# Patient Record
Sex: Male | Born: 1960 | Race: White | Hispanic: No | Marital: Married | Smoking: Never smoker
Health system: Southern US, Community
[De-identification: ages and names within clinical notes are randomized; demographics above are authoritative.]

## PROBLEM LIST (undated history)

## (undated) ENCOUNTER — Emergency Department (HOSPITAL_COMMUNITY): Payer: PRIVATE HEALTH INSURANCE

## (undated) DIAGNOSIS — I10 Essential (primary) hypertension: Secondary | ICD-10-CM

## (undated) DIAGNOSIS — E785 Hyperlipidemia, unspecified: Secondary | ICD-10-CM

## (undated) DIAGNOSIS — Z87442 Personal history of urinary calculi: Secondary | ICD-10-CM

---

## 2006-06-03 ENCOUNTER — Emergency Department (HOSPITAL_COMMUNITY): Admission: EM | Admit: 2006-06-03 | Discharge: 2006-06-03 | Payer: Self-pay | Admitting: Emergency Medicine

## 2006-06-27 ENCOUNTER — Ambulatory Visit (HOSPITAL_COMMUNITY): Admission: RE | Admit: 2006-06-27 | Discharge: 2006-06-27 | Payer: Self-pay | Admitting: Urology

## 2008-08-01 ENCOUNTER — Encounter (INDEPENDENT_AMBULATORY_CARE_PROVIDER_SITE_OTHER): Payer: Self-pay | Admitting: Family Medicine

## 2008-08-01 ENCOUNTER — Ambulatory Visit: Payer: Self-pay | Admitting: Cardiology

## 2008-08-01 ENCOUNTER — Ambulatory Visit (HOSPITAL_COMMUNITY): Admission: RE | Admit: 2008-08-01 | Discharge: 2008-08-01 | Payer: Self-pay | Admitting: Family Medicine

## 2012-03-25 ENCOUNTER — Ambulatory Visit (HOSPITAL_COMMUNITY)
Admission: RE | Admit: 2012-03-25 | Discharge: 2012-03-25 | Disposition: A | Discharge status: Home / Self Care | Payer: PRIVATE HEALTH INSURANCE | Source: Ambulatory Visit | Attending: Family Medicine | Admitting: Family Medicine

## 2012-03-25 ENCOUNTER — Other Ambulatory Visit (HOSPITAL_COMMUNITY): Payer: Self-pay | Admitting: Family Medicine

## 2012-03-25 DIAGNOSIS — R05 Cough: Secondary | ICD-10-CM

## 2012-03-25 DIAGNOSIS — R059 Cough, unspecified: Secondary | ICD-10-CM | POA: Insufficient documentation

## 2012-03-25 DIAGNOSIS — R062 Wheezing: Secondary | ICD-10-CM | POA: Insufficient documentation

## 2012-03-25 DIAGNOSIS — M47814 Spondylosis without myelopathy or radiculopathy, thoracic region: Secondary | ICD-10-CM | POA: Insufficient documentation

## 2017-02-07 ENCOUNTER — Telehealth: Payer: Self-pay

## 2017-02-23 NOTE — Telephone Encounter (Signed)
Waiting on call from pt to see if he has ever had any complications with anesthesia, so I can complete the triage.

## 2017-02-28 ENCOUNTER — Other Ambulatory Visit: Payer: Self-pay

## 2017-02-28 DIAGNOSIS — Z1211 Encounter for screening for malignant neoplasm of colon: Secondary | ICD-10-CM

## 2017-02-28 NOTE — Telephone Encounter (Signed)
Gastroenterology Pre-Procedure Review  Request Date: 02/07/2017 Requesting Physician: Hilma Favors  PATIENT REVIEW QUESTIONS: The patient responded to the following health history questions as indicated:    1. Diabetes Melitis: no 2. Joint replacements in the past 12 months: no 3. Major health problems in the past 3 months: no 4. Has an artificial valve or MVP: no 5. Has a defibrillator: no 6. Has been advised in past to take antibiotics in advance of a procedure like teeth cleaning: no 7. Family history of colon cancer: no  8. Alcohol Use: no 9. History of sleep apnea: no  10. History of coronary artery or other vascular stents placed within the last 12 months: no 11. History of any prior anesthesia complications: no    MEDICATIONS & ALLERGIES:    Patient reports the following regarding taking any blood thinners:   Plavix? no Aspirin? no Coumadin? no Brilinta? no Xarelto? no Eliquis? no Pradaxa? no Savaysa? no Effient? no  Patient confirms/reports the following medications:  Current Outpatient Prescriptions  Medication Sig Dispense Refill  . aspirin EC 81 MG tablet Take 81 mg by mouth daily.    Marland Kitchen atorvastatin (LIPITOR) 10 MG tablet Take 10 mg by mouth daily.    Marland Kitchen olmesartan (BENICAR) 20 MG tablet Take 20 mg by mouth daily.     No current facility-administered medications for this visit.     Patient confirms/reports the following allergies:  Allergies  Allergen Reactions  . Hydrocodone Nausea Only    No orders of the defined types were placed in this encounter.   AUTHORIZATION INFORMATION Primary Insurance:   ID #:  Group #:  Pre-Cert / Auth required:  Pre-Cert / Auth #:   Secondary Insurance:  ID #:   Group #:  Pre-Cert / Auth required:  Pre-Cert / Auth #:   SCHEDULE INFORMATION: Procedure has been scheduled as follows:  Date: 03/18/2017               Time:  11:30 AM  Location: [facility] Short Stay  This Gastroenterology Pre-Precedure Review Form is  being routed to the following provider(s): R. Garfield Cornea, MD

## 2017-02-28 NOTE — Telephone Encounter (Signed)
Appropriate.

## 2017-03-01 MED ORDER — PEG 3350-KCL-NA BICARB-NACL 420 G PO SOLR: 4000.0000 mL | ORAL | 0 refills | Status: DC

## 2017-03-01 NOTE — Telephone Encounter (Signed)
Rx sent to the pharmacy and instructions mailed to pt.  

## 2017-03-03 NOTE — Telephone Encounter (Signed)
NO PA is needed for TCS 

## 2017-03-18 ENCOUNTER — Ambulatory Visit (HOSPITAL_COMMUNITY)
Admission: RE | Admit: 2017-03-18 | Discharge: 2017-03-18 | Disposition: A | Discharge status: Home / Self Care | Payer: PRIVATE HEALTH INSURANCE | Source: Ambulatory Visit | Attending: Internal Medicine | Admitting: Internal Medicine

## 2017-03-18 ENCOUNTER — Encounter (HOSPITAL_COMMUNITY): Payer: Self-pay | Admitting: *Deleted

## 2017-03-18 ENCOUNTER — Encounter (HOSPITAL_COMMUNITY)
Admission: RE | Disposition: A | Discharge status: Home / Self Care | Payer: Self-pay | Source: Ambulatory Visit | Attending: Internal Medicine

## 2017-03-18 DIAGNOSIS — Z885 Allergy status to narcotic agent status: Secondary | ICD-10-CM | POA: Diagnosis not present

## 2017-03-18 DIAGNOSIS — D122 Benign neoplasm of ascending colon: Secondary | ICD-10-CM | POA: Diagnosis not present

## 2017-03-18 DIAGNOSIS — Z8249 Family history of ischemic heart disease and other diseases of the circulatory system: Secondary | ICD-10-CM | POA: Insufficient documentation

## 2017-03-18 DIAGNOSIS — Z1211 Encounter for screening for malignant neoplasm of colon: Secondary | ICD-10-CM

## 2017-03-18 DIAGNOSIS — I10 Essential (primary) hypertension: Secondary | ICD-10-CM | POA: Insufficient documentation

## 2017-03-18 DIAGNOSIS — Z7982 Long term (current) use of aspirin: Secondary | ICD-10-CM | POA: Diagnosis not present

## 2017-03-18 DIAGNOSIS — Z809 Family history of malignant neoplasm, unspecified: Secondary | ICD-10-CM | POA: Diagnosis not present

## 2017-03-18 DIAGNOSIS — Z79899 Other long term (current) drug therapy: Secondary | ICD-10-CM | POA: Diagnosis not present

## 2017-03-18 DIAGNOSIS — Z1212 Encounter for screening for malignant neoplasm of rectum: Secondary | ICD-10-CM

## 2017-03-18 DIAGNOSIS — Z87442 Personal history of urinary calculi: Secondary | ICD-10-CM | POA: Diagnosis not present

## 2017-03-18 DIAGNOSIS — Z823 Family history of stroke: Secondary | ICD-10-CM | POA: Diagnosis not present

## 2017-03-18 HISTORY — DX: Personal history of urinary calculi: Z87.442

## 2017-03-18 HISTORY — PX: COLONOSCOPY: SHX5424

## 2017-03-18 HISTORY — DX: Essential (primary) hypertension: I10

## 2017-03-18 SURGERY — COLONOSCOPY
Anesthesia: Moderate Sedation

## 2017-03-18 MED ORDER — STERILE WATER FOR IRRIGATION IR SOLN
Status: DC | PRN
  Administered 2017-03-18: 2.5 mL

## 2017-03-18 MED ORDER — ONDANSETRON HCL 4 MG/2ML IJ SOLN
INTRAMUSCULAR | Status: AC
  Filled 2017-03-18: qty 2

## 2017-03-18 MED ORDER — MEPERIDINE HCL 100 MG/ML IJ SOLN
INTRAMUSCULAR | Status: AC
  Filled 2017-03-18: qty 2

## 2017-03-18 MED ORDER — MIDAZOLAM HCL 5 MG/5ML IJ SOLN
INTRAMUSCULAR | Status: AC
  Filled 2017-03-18: qty 10

## 2017-03-18 MED ORDER — MIDAZOLAM HCL 5 MG/5ML IJ SOLN
INTRAMUSCULAR | Status: DC | PRN
  Administered 2017-03-18: 1 mg via INTRAVENOUS
  Administered 2017-03-18 (×2): 2 mg via INTRAVENOUS
  Administered 2017-03-18: 1 mg via INTRAVENOUS

## 2017-03-18 MED ORDER — ONDANSETRON HCL 4 MG/2ML IJ SOLN
INTRAMUSCULAR | Status: DC | PRN
  Administered 2017-03-18: 4 mg via INTRAVENOUS

## 2017-03-18 MED ORDER — MEPERIDINE HCL 100 MG/ML IJ SOLN
INTRAMUSCULAR | Status: DC | PRN
  Administered 2017-03-18: 25 mg via INTRAVENOUS
  Administered 2017-03-18 (×2): 50 mg via INTRAVENOUS

## 2017-03-18 MED ORDER — SODIUM CHLORIDE 0.9 % IV SOLN
INTRAVENOUS | Status: DC
  Administered 2017-03-18: 1000 mL via INTRAVENOUS

## 2017-03-18 NOTE — Op Note (Signed)
[facility] Patient Name: Clifford [name] Procedure Date: 03/18/2017 11:13 AM MRN: [id number] Date of Birth: 1961/06/19 Attending MD: Norvel Richards , MD CSN: [id number] Age: 56 Admit Type: Outpatient Procedure:                Colonoscopy Indications:              Screening for colorectal malignant neoplasm Providers:                Norvel Richards, MD, Lurline Del, RN, Purcell Nails.                            [place], [employer] Referring MD:              Medicines:                Midazolam 6 mg IV, Meperidine 125 mg IV,                            Ondansetron 4 mg IV Complications:            No immediate complications. Estimated Blood Loss:     Estimated blood loss was minimal. Procedure:                Pre-Anesthesia Assessment:                           - Prior to the procedure, a History and Physical                            was performed, and patient medications and                            allergies were reviewed. The patient's tolerance of                            previous anesthesia was also reviewed. The risks                            and benefits of the procedure and the sedation                            options and risks were discussed with the patient.                            All questions were answered, and informed consent                            was obtained. Prior Anticoagulants: The patient has                            taken no previous anticoagulant or antiplatelet                            agents. ASA Grade Assessment: III - A patient with  severe systemic disease. After reviewing the risks                            and benefits, the patient was deemed in                            satisfactory condition to undergo the procedure.                           After obtaining informed consent, the colonoscope                            was passed under direct vision. Throughout the                            procedure,  the patient's blood pressure, pulse, and                            oxygen saturations were monitored continuously. The                            EC-[id number] ([id number]) scope was introduced through                            the anus and advanced to the the cecum, identified                            by appendiceal orifice and ileocecal valve. The                            colonoscopy was performed without difficulty. The                            patient tolerated the procedure well. The quality                            of the bowel preparation was adequate. The                            ileocecal valve, appendiceal orifice, and rectum                            were photographed. Scope In: 11:22:14 AM Scope Out: 11:39:10 AM Scope Withdrawal Time: 0 hours 14 minutes 0 seconds  Total Procedure Duration: 0 hours 16 minutes 56 seconds  Findings:      The perianal and digital rectal examinations were normal.      A 4 mm polyp was found in the ascending colon. The polyp was sessile.       The polyp was removed with a cold snare. Resection and retrieval were       complete. Estimated blood loss was minimal.      The exam was otherwise without abnormality on direct and retroflexion       views. Impression:               -  One 4 mm polyp in the ascending colon, removed                            with a cold snare. Resected and retrieved.                           - The examination was otherwise normal on direct                            and retroflexion views. Moderate Sedation:      Moderate (conscious) sedation was administered by the endoscopy nurse       and supervised by the endoscopist. The following parameters were       monitored: oxygen saturation, heart rate, blood pressure, respiratory       rate, EKG, adequacy of pulmonary ventilation, and response to care.       Total physician intraservice time was 26 minutes. Recommendation:           - Patient has a contact number  available for                            emergencies. The signs and symptoms of potential                            delayed complications were discussed with the                            patient. Return to normal activities tomorrow.                            Written discharge instructions were provided to the                            patient.                           - Resume previous diet.                           - Continue present medications.                           - Repeat colonoscopy date to be determined after                            pending pathology results are reviewed for                            surveillance based on pathology results.                           - Return to GI clinic (date not yet determined). Procedure Code(s):        --- Professional ---                           [phone], Colonoscopy, flexible; with removal  of                            tumor(s), polyp(s), or other lesion(s) by snare                            technique                           [id number], Moderate sedation services provided by the                            same physician or other qualified health care                            professional performing the diagnostic or                            therapeutic service that the sedation supports,                            requiring the presence of an independent trained                            observer to assist in the monitoring of the                            patient's level of consciousness and physiological                            status; initial 15 minutes of intraservice time,                            patient age 91 years or older                           [phone], Moderate sedation services; each additional                            15 minutes intraservice time Diagnosis Code(s):        --- Professional ---                           Z12.11, Encounter for screening for malignant                            neoplasm of  colon                           D12.2, Benign neoplasm of ascending colon CPT copyright 2016 American Medical Association. All rights reserved. The codes documented in this report are preliminary and upon coder review may  be revised to meet current compliance requirements. Cristopher Estimable. Svetlana Bagby, MD Norvel Richards, MD 03/18/2017 11:44:46 AM This report has been signed electronically. Number of Addenda: 0

## 2017-03-18 NOTE — H&P (Signed)
@  KWIO@   Primary Care Physician:  Sharilyn Sites, MD Primary Gastroenterologist:  Dr. Gala Romney  Pre-Procedure History & Physical: HPI:  Clifford [name] is a 56 y.o. male is here for a screening colonoscopy. No bowel symptoms. No family history of colon cancer.  Colonoscopy.  Past Medical History:  Diagnosis Date  . History of kidney stones   . Hypertension     History reviewed. No pertinent surgical history.  Prior to Admission medications   Medication Sig Start Date End Date Taking? Authorizing Provider  aspirin EC 81 MG tablet Take 81 mg by mouth daily.   Yes [provider]  atorvastatin (LIPITOR) 10 MG tablet Take 10 mg by mouth at bedtime.    Yes [provider]  olmesartan (BENICAR) 20 MG tablet Take 20 mg by mouth daily.   Yes [provider]    Allergies as of 02/28/2017 - Review Complete 02/21/2017  Allergen Reaction Noted  . Hydrocodone Nausea Only 02/07/2017    Family History  Problem Relation Age of Onset  . Cancer Mother   . Heart Problems Mother   . Heart attack Father   . Stroke Other     Social History   Social History  . Marital status: Married    Spouse name: N/A  . Number of children: N/A  . Years of education: N/A   Occupational History  . Not on file.   Social History Main Topics  . Smoking status: Never Smoker  . Smokeless tobacco: Never Used  . Alcohol use No  . Drug use: No  . Sexual activity: Not on file   Other Topics Concern  . Not on file   Social History Narrative  . No narrative on file    Review of Systems: See HPI, otherwise negative ROS  Physical Exam: BP 128/72   Pulse 94   Temp 98.4 F (36.9 C) (Oral)   Resp 15   Ht 5\' 6"  (1.676 m)   Wt 200 lb (90.7 kg)   SpO2 97%   BMI 32.28 kg/m  General:   Alert,  Well-developed, well-nourished, pleasant and cooperative in NAD Head:  Normocephalic and atraumatic. Lungs:  Clear throughout to auscultation.   No wheezes, crackles, or rhonchi. No acute  distress. Heart:  Regular rate and rhythm; 2 to 3/6 systolic ejection murmur , clicks, rubs,  or gallops. Abdomen:  Soft, nontender and nondistended. No masses, hepatosplenomegaly or hernias noted. Normal bowel sounds, without guarding, and without rebound.    Impression/Plan: Clifford [name] is now here to undergo a screening colonoscopy.  First ever average risk screening examination. Risks, benefits, limitations, imponderables and alternatives regarding colonoscopy have been reviewed with the patient. Questions have been answered. All parties agreeable.        Notice:  This dictation was prepared with Dragon dictation along with smaller phrase technology. Any transcriptional errors that result from this process are unintentional and may not be corrected upon review.

## 2017-03-18 NOTE — Discharge Instructions (Addendum)
Colon Polyps °Polyps are tissue growths inside the body. Polyps can grow in many places, including the large intestine (colon). A polyp may be a round bump or a mushroom-shaped growth. You could have one polyp or several. °Most colon polyps are noncancerous (benign). However, some colon polyps can become cancerous over time. °What are the causes? °The exact cause of colon polyps is not known. °What increases the risk? °This condition is more likely to develop in people who: °· Have a family history of colon cancer or colon polyps. °· Are older than 50 or older than 45 if they are African American. °· Have inflammatory bowel disease, such as ulcerative colitis or Crohn disease. °· Are overweight. °· Smoke cigarettes. °· Do not get enough exercise. °· Drink too much alcohol. °· Eat a diet that is: °? High in fat and red meat. °? Low in fiber. °· Had childhood cancer that was treated with abdominal radiation. ° °What are the signs or symptoms? °Most polyps do not cause symptoms. If you have symptoms, they may include: °· Blood coming from your rectum when having a bowel movement. °· Blood in your stool. The stool may look dark red or black. °· A change in bowel habits, such as constipation or diarrhea. ° °How is this diagnosed? °This condition is diagnosed with a colonoscopy. This is a procedure that uses a lighted, flexible scope to look at the inside of your colon. °How is this treated? °Treatment for this condition involves removing any polyps that are found. Those polyps will then be tested for cancer. If cancer is found, your health care provider will talk to you about options for colon cancer treatment. °Follow these instructions at home: °Diet °· Eat plenty of fiber, such as fruits, vegetables, and whole grains. °· Eat foods that are high in calcium and vitamin D, such as milk, cheese, yogurt, eggs, liver, fish, and broccoli. °· Limit foods high in fat, red meats, and processed meats, such as hot dogs, sausage,  bacon, and lunch meats. °· Maintain a healthy weight, or lose weight if recommended by your health care provider. °General instructions °· Do not smoke cigarettes. °· Do not drink alcohol excessively. °· Keep all follow-up visits as told by your health care provider. This is important. This includes keeping regularly scheduled colonoscopies. Talk to your health care provider about when you need a colonoscopy. °· Exercise every day or as told by your health care provider. °Contact a health care provider if: °· You have new or worsening bleeding during a bowel movement. °· You have new or increased blood in your stool. °· You have a change in bowel habits. °· You unexpectedly lose weight. °This information is not intended to replace advice given to you by your health care provider. Make sure you discuss any questions you have with your health care provider. °Document Released: 04/14/2004 Document Revised: 12/25/2015 Document Reviewed: 06/09/2015 °Elsevier Interactive Patient Education © 2018 [employer] ° °Colonoscopy °Discharge Instructions ° °Read the instructions outlined below and refer to this sheet in the next few weeks. These discharge instructions provide you with general information on caring for yourself after you leave the hospital. Your doctor may also give you specific instructions. While your treatment has been planned according to the most current medical practices available, unavoidable complications occasionally occur. If you have any problems or questions after discharge, call Dr. Rourk at [phone]. °ACTIVITY °· You may resume your regular activity, but move at a slower pace for the next 24   hours.  °· Take frequent rest periods for the next 24 hours.  °· Walking will help get rid of the air and reduce the bloated feeling in your belly (abdomen).  °· No driving for 24 hours (because of the medicine (anesthesia) used during the test).   °· Do not sign any important legal documents or operate any  machinery for 24 hours (because of the anesthesia used during the test).  °NUTRITION °· Drink plenty of fluids.  °· You may resume your normal diet as instructed by your doctor.  °· Begin with a light meal and progress to your normal diet. Heavy or fried foods are harder to digest and may make you feel sick to your stomach (nauseated).  °· Avoid alcoholic beverages for 24 hours or as instructed.  °MEDICATIONS °· You may resume your normal medications unless your doctor tells you otherwise.  °WHAT YOU CAN EXPECT TODAY °· Some feelings of bloating in the abdomen.  °· Passage of more gas than usual.  °· Spotting of blood in your stool or on the toilet paper.  °IF YOU HAD POLYPS REMOVED DURING THE COLONOSCOPY: °· No aspirin products for 7 days or as instructed.  °· No alcohol for 7 days or as instructed.  °· Eat a soft diet for the next 24 hours.  °FINDING OUT THE RESULTS OF YOUR TEST °Not all test results are available during your visit. If your test results are not back during the visit, make an appointment with your caregiver to find out the results. Do not assume everything is normal if you have not heard from your caregiver or the medical facility. It is important for you to follow up on all of your test results.  °SEEK IMMEDIATE MEDICAL ATTENTION IF: °· You have more than a spotting of blood in your stool.  °· Your belly is swollen (abdominal distention).  °· You are nauseated or vomiting.  °· You have a temperature over 101.  °· You have abdominal pain or discomfort that is severe or gets worse throughout the day.  ° ° ° °Colon polyp information provided ° °Further recommendations to follow pending review of pathology report °

## 2017-03-22 ENCOUNTER — Encounter: Payer: Self-pay | Admitting: Internal Medicine

## 2017-03-23 ENCOUNTER — Encounter (HOSPITAL_COMMUNITY): Payer: Self-pay | Admitting: Internal Medicine

## 2019-06-11 ENCOUNTER — Encounter (HOSPITAL_COMMUNITY): Payer: Self-pay | Admitting: Emergency Medicine

## 2019-06-11 ENCOUNTER — Emergency Department (HOSPITAL_COMMUNITY)
Admission: EM | Admit: 2019-06-11 | Discharge: 2019-06-11 | Disposition: A | Discharge status: Home / Self Care | Payer: PRIVATE HEALTH INSURANCE | Attending: Emergency Medicine | Admitting: Emergency Medicine

## 2019-06-11 ENCOUNTER — Other Ambulatory Visit: Payer: Self-pay

## 2019-06-11 ENCOUNTER — Emergency Department (HOSPITAL_COMMUNITY): Payer: PRIVATE HEALTH INSURANCE

## 2019-06-11 DIAGNOSIS — I10 Essential (primary) hypertension: Secondary | ICD-10-CM | POA: Diagnosis not present

## 2019-06-11 DIAGNOSIS — R109 Unspecified abdominal pain: Secondary | ICD-10-CM | POA: Diagnosis not present

## 2019-06-11 DIAGNOSIS — Z7982 Long term (current) use of aspirin: Secondary | ICD-10-CM | POA: Diagnosis not present

## 2019-06-11 DIAGNOSIS — Z79899 Other long term (current) drug therapy: Secondary | ICD-10-CM | POA: Insufficient documentation

## 2019-06-11 DIAGNOSIS — N23 Unspecified renal colic: Secondary | ICD-10-CM | POA: Diagnosis not present

## 2019-06-11 HISTORY — DX: Hyperlipidemia, unspecified: E78.5

## 2019-06-11 LAB — URINALYSIS, ROUTINE W REFLEX MICROSCOPIC
Bacteria, UA: NONE SEEN
Bilirubin Urine: NEGATIVE
Glucose, UA: NEGATIVE mg/dL
Ketones, ur: NEGATIVE mg/dL
Leukocytes,Ua: NEGATIVE
Nitrite: NEGATIVE
Protein, ur: 30 mg/dL — AB
Specific Gravity, Urine: 1.021 (ref 1.005–1.030)
pH: 5 (ref 5.0–8.0)

## 2019-06-11 LAB — BASIC METABOLIC PANEL
Anion gap: 6 (ref 5–15)
BUN: 17 mg/dL (ref 6–20)
CO2: 24 mmol/L (ref 22–32)
Calcium: 8.6 mg/dL — ABNORMAL LOW (ref 8.9–10.3)
Chloride: 108 mmol/L (ref 98–111)
Creatinine, Ser: 1.04 mg/dL (ref 0.61–1.24)
GFR calc Af Amer: >60 mL/min (ref >60)
GFR calc non Af Amer: >60 mL/min (ref >60)
Glucose, Bld: 132 mg/dL — ABNORMAL HIGH (ref 70–99)
Potassium: 4.3 mmol/L (ref 3.5–5.1)
Sodium: 138 mmol/L (ref 135–145)

## 2019-06-11 LAB — CBC WITH DIFFERENTIAL/PLATELET
Abs Immature Granulocytes: 0.03 10*3/uL (ref 0.00–0.07)
Basophils Absolute: 0 10*3/uL (ref 0.0–0.1)
Basophils Relative: 0 %
Eosinophils Absolute: 0 10*3/uL (ref 0.0–0.5)
Eosinophils Relative: 0 %
HCT: 44.1 % (ref 39.0–52.0)
Hemoglobin: 13.8 g/dL (ref 13.0–17.0)
Immature Granulocytes: 0 %
Lymphocytes Relative: 18 %
Lymphs Abs: 1.7 10*3/uL (ref 0.7–4.0)
MCH: 28.5 pg (ref 26.0–34.0)
MCHC: 31.3 g/dL (ref 30.0–36.0)
MCV: 90.9 fL (ref 80.0–100.0)
Monocytes Absolute: 0.7 10*3/uL (ref 0.1–1.0)
Monocytes Relative: 7 %
Neutro Abs: 7.4 10*3/uL (ref 1.7–7.7)
Neutrophils Relative %: 75 %
Platelets: 233 10*3/uL (ref 150–400)
RBC: 4.85 MIL/uL (ref 4.22–5.81)
RDW: 13 % (ref 11.5–15.5)
WBC: 9.9 10*3/uL (ref 4.0–10.5)
nRBC: 0 % (ref 0.0–0.2)

## 2019-06-11 MED ORDER — OXYCODONE-ACETAMINOPHEN 5-325 MG PREPACK: 1.0000 | ORAL_TABLET | ORAL | 0 refills | Status: DC | PRN

## 2019-06-11 MED ORDER — TAMSULOSIN HCL 0.4 MG PO CAPS: 0.4000 mg | ORAL_CAPSULE | Freq: Every day | ORAL | 0 refills | Status: DC

## 2019-06-11 MED ORDER — ONDANSETRON 4 MG PO TBDP: 4.0000 mg | ORAL_TABLET | Freq: Three times a day (TID) | ORAL | 0 refills | Status: DC | PRN

## 2019-06-11 MED ORDER — OXYCODONE-ACETAMINOPHEN 5-325 MG PO TABS: 1.0000 | ORAL_TABLET | ORAL | 0 refills | Status: AC | PRN

## 2019-06-11 NOTE — ED Provider Notes (Signed)
[facility] EMERGENCY DEPARTMENT Provider Note   CSN: [id number] Arrival date & time: 06/11/19  1624     History   Chief Complaint Chief Complaint  Patient presents with   Flank Pain    HPI Clifford [name] is a 58 y.o. male.     Pt complains of left flank pain.  Pt reports pain felt like previous kidney stones.  Pt took 2 percocet with pain relief.   The history is provided by the patient. No language interpreter was used.  Flank Pain This is a new problem. The current episode started 3 to 5 hours ago. The problem occurs constantly. The problem has not changed since onset.Nothing aggravates the symptoms. Nothing relieves the symptoms. He has tried nothing for the symptoms. The treatment provided no relief.    Past Medical History:  Diagnosis Date   History of kidney stones    Hyperlipidemia    Hypertension     There are no active problems to display for this patient.   Past Surgical History:  Procedure Laterality Date   COLONOSCOPY N/A 03/18/2017   Procedure: COLONOSCOPY;  Surgeon: Daneil Dolin, MD;  Location: [facility];  Service: Endoscopy;  Laterality: N/A;  11:30 AM        Home Medications    Prior to Admission medications   Medication Sig Start Date End Date Taking? Authorizing Provider  aspirin EC 81 MG tablet Take 81 mg by mouth daily.    [provider]  atorvastatin (LIPITOR) 10 MG tablet Take 10 mg by mouth at bedtime.     [provider]  olmesartan (BENICAR) 20 MG tablet Take 20 mg by mouth daily.    [provider]  ondansetron (ZOFRAN ODT) 4 MG disintegrating tablet Take 1 tablet (4 mg total) by mouth every 8 (eight) hours as needed for nausea or vomiting. 06/11/19   Fransico Meadow, PA-C  oxyCODONE-acetaminophen (PERCOCET) 5-325 MG tablet Take 1 tablet by mouth every 4 (four) hours as needed for severe pain. 06/11/19 06/10/20  Fransico Meadow, PA-C  oxyCODONE-acetaminophen (PERCOCET) 5-325 mg TABS tablet Take 6  tablets by mouth every 4 (four) hours as needed. 06/11/19   Fransico Meadow, PA-C  tamsulosin (FLOMAX) 0.4 MG CAPS capsule Take 1 capsule (0.4 mg total) by mouth daily. 06/11/19   Fransico Meadow, PA-C    Family History Family History  Problem Relation Age of Onset   Cancer Mother    Heart Problems Mother    Heart attack Father    Stroke Other     Social History Social History   Tobacco Use   Smoking status: Never Smoker   Smokeless tobacco: Never Used  Substance Use Topics   Alcohol use: No   Drug use: No     Allergies   Hydrocodone   Review of Systems Review of Systems  Genitourinary: Positive for flank pain.  All other systems reviewed and are negative.    Physical Exam Updated Vital Signs BP 133/87 (BP Location: Right Arm)    Pulse 68    Temp 98.7 F (37.1 C) (Oral)    Resp 18    Ht 5\' 6"  (1.676 m)    Wt 97.5 kg    SpO2 97%    BMI 34.70 kg/m   Physical Exam Vitals signs and nursing note reviewed.  Constitutional:      Appearance: He is well-developed.  HENT:     Head: Normocephalic and atraumatic.     Nose: Nose  normal.     Mouth/Throat:     Mouth: Mucous membranes are moist.  Eyes:     Conjunctiva/sclera: Conjunctivae normal.  Neck:     Musculoskeletal: Neck supple.  Cardiovascular:     Rate and Rhythm: Normal rate and regular rhythm.     Heart sounds: No murmur.  Pulmonary:     Effort: Pulmonary effort is normal. No respiratory distress.     Breath sounds: Normal breath sounds.  Abdominal:     Palpations: Abdomen is soft.     Tenderness: There is no abdominal tenderness.  Musculoskeletal: Normal range of motion.  Skin:    General: Skin is warm and dry.  Neurological:     General: No focal deficit present.     Mental Status: He is alert.  Psychiatric:        Mood and Affect: Mood normal.      ED Treatments / Results  Labs (all labs ordered are listed, but only abnormal results are displayed) Labs Reviewed  URINALYSIS, ROUTINE  W REFLEX MICROSCOPIC - Abnormal; Notable for the following components:      Result Value   Hgb urine dipstick LARGE (*)    Protein, ur 30 (*)    All other components within normal limits  BASIC METABOLIC PANEL - Abnormal; Notable for the following components:   Glucose, Bld 132 (*)    Calcium 8.6 (*)    All other components within normal limits  CBC WITH DIFFERENTIAL/PLATELET    EKG None  Radiology Ct Renal Stone Study  Result Date: 06/11/2019 CLINICAL DATA:  LEFT pain pain since 1 p.m. Pt c/o left flank pain since around 1pm today, no visible blood in urine, denies n,v,c,d, remote h/o renal stones FULL STANDARD DOSE RENAL STONE PROTOCOL USEDFlank pain, stone disease suspected EXAM: CT ABDOMEN AND PELVIS WITHOUT CONTRAST TECHNIQUE: Multidetector CT imaging of the abdomen and pelvis was performed following the standard protocol without IV contrast. COMPARISON:  CT 06/03/2006 FINDINGS: Lower chest: Lung bases are clear. Hepatobiliary: Low-density lesion LEFT hepatic lobe measuring 1.7 cm cannot be fully characterize on noncontrast exam. Lesion not clearly seen on comparison noncontrast CT. Several gallstones noted. No biliary duct dilatation. Pancreas: Pancreas is normal. No ductal dilatation. No pancreatic inflammation. Spleen: Normal spleen Adrenals/urinary tract: Adrenal glands normal. Mild hydroureter on the LEFT secondary to obstructing calculus in the distal LEFT ureter this calculus measures 5 mm and is approximately 1 cm from the LEFT vascular ureteral junction. Approximately 4 distal calculi within the RIGHT kidney measuring size from 1-3 mm. No RIGHT ureterolithiasis. No bladder calculi. Stomach/Bowel: Stomach, small bowel, appendix, and cecum are normal. The colon and rectosigmoid colon are normal. Vascular/Lymphatic: Abdominal aorta is normal caliber. No periportal or retroperitoneal adenopathy. No pelvic adenopathy. Reproductive: Prostate normal Other: No free fluid. Musculoskeletal: No  aggressive osseous lesion. IMPRESSION: 1. Obstructing calculus in the distal LEFT ureter with mild hydroureter. 2. Multiple small RIGHT renal calculi. 3. Low-density lesion in the LEFT hepatic lobe cannot be fully characterized on noncontrast exam. Recommend MRI with without contrast for further characterization. Electronically Signed   By: Suzy Bouchard M.D.   On: 06/11/2019 20:18    Procedures Procedures (including critical care time)  Medications Ordered in ED Medications - No data to display   Initial Impression / Assessment and Plan / ED Course  I have reviewed the triage vital signs and the nursing notes.  Pertinent labs & imaging results that were available during my care of the patient were reviewed  by me and considered in my medical decision making (see chart for details).        MDM  Ct reviewed and discussed with pt.  Pt advised of kidney stones and current calculus in left distal ureter. Pt advised to follow up with urologist.  Pt given rx for percocet. zofran and flomax.  Pt advised to follow up with primary care   Final Clinical Impressions(s) / ED Diagnoses   Final diagnoses:  Left flank pain  Ureteral colic    ED Discharge Orders         Ordered    tamsulosin (FLOMAX) 0.4 MG CAPS capsule  Daily     06/11/19 2235    oxyCODONE-acetaminophen (PERCOCET) 5-325 MG tablet  Every 4 hours PRN     06/11/19 2235    ondansetron (ZOFRAN ODT) 4 MG disintegrating tablet  Every 8 hours PRN     06/11/19 2235    oxyCODONE-acetaminophen (PERCOCET) 5-325 mg TABS tablet  Every 4 hours PRN     06/11/19 2241           Sidney Ace 06/11/19 2326    Noemi Chapel, MD 06/13/19 [id number]

## 2019-06-11 NOTE — ED Notes (Signed)
Patient states no pain at this time. Patient states he will try to give a urine sample. Patient urinated while he was waiting in the lobby. Patient given water to drink.

## 2019-06-12 MED FILL — Oxycodone w/ Acetaminophen Tab 5-325 MG: ORAL | Qty: 6 | Status: AC

## 2021-05-18 ENCOUNTER — Other Ambulatory Visit (HOSPITAL_COMMUNITY): Payer: Self-pay

## 2021-05-18 MED ORDER — INFLUENZA VAC SPLIT QUAD 0.5 ML IM SUSY
PREFILLED_SYRINGE | INTRAMUSCULAR | 0 refills | Status: DC
  Filled 2021-05-18: qty 0.5, 1d supply, fill #0

## 2021-06-19 IMAGING — CT CT RENAL STONE PROTOCOL
1 series · 10 of 34 positions shown, 12 images · non-contrast
Comparison: CT 06/03/2006

CLINICAL DATA: LEFT pain pain since 1 p.m. Pt c/o left flank pain
since around 1pm today, no visible blood in urine, denies n,v,c,d,
remote h/o renal stones FULL STANDARD DOSE RENAL STONE PROTOCOL
USEDFlank pain, stone disease suspected

EXAM:
CT ABDOMEN AND PELVIS WITHOUT CONTRAST
TECHNIQUE: Multidetector CT imaging of the abdomen and pelvis was performed
following the standard protocol without IV contrast.

[Series 6: sagittal st · sagittal · 0.69mm/px · 10 of 149 slices shown, 12 images]
[im 15/149  soft-tissue]
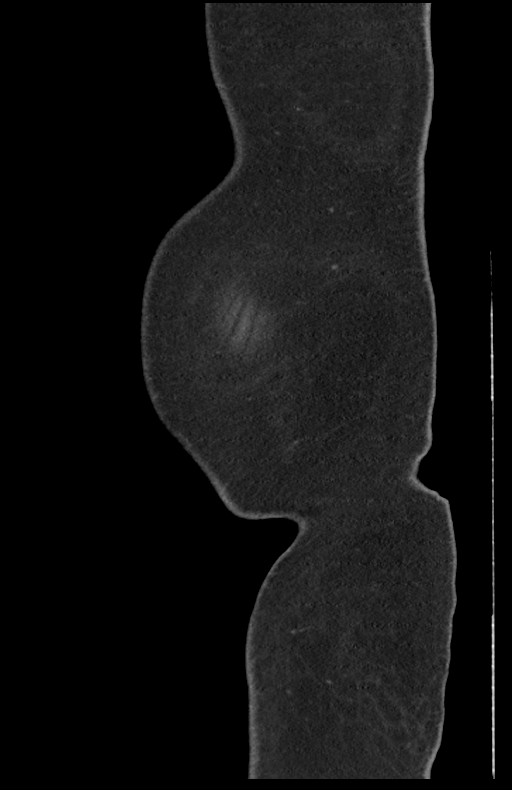
[im 15/149  bone]
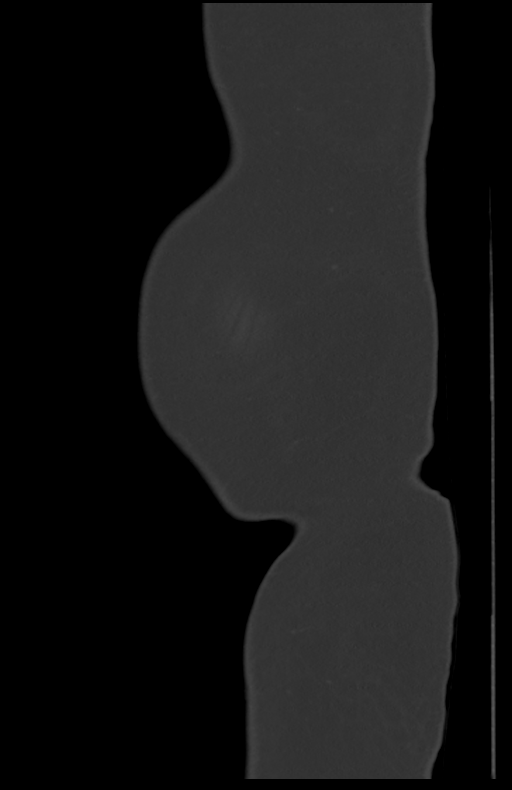
[im 34/149  soft-tissue]
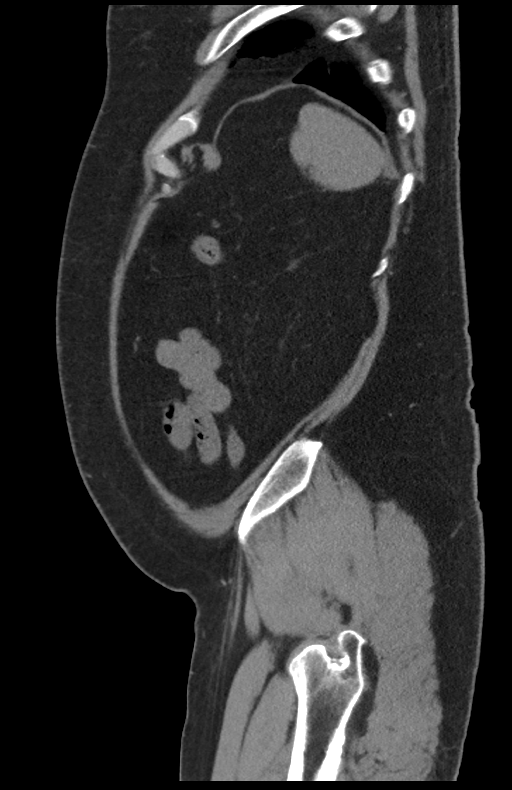
[im 53/149  soft-tissue]
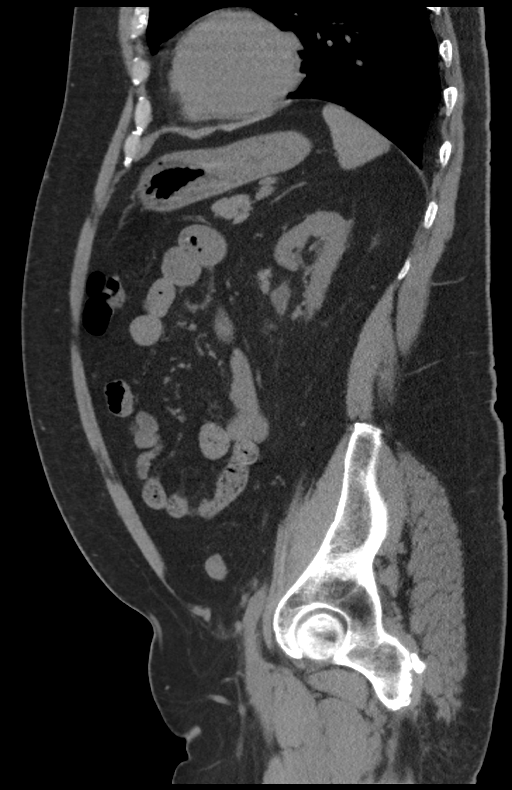
[im 77/149  soft-tissue]
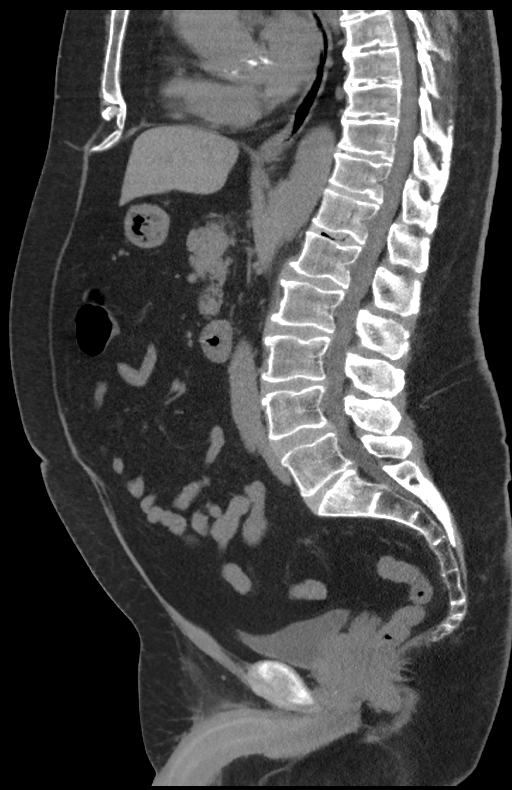
[im 96/149  soft-tissue]
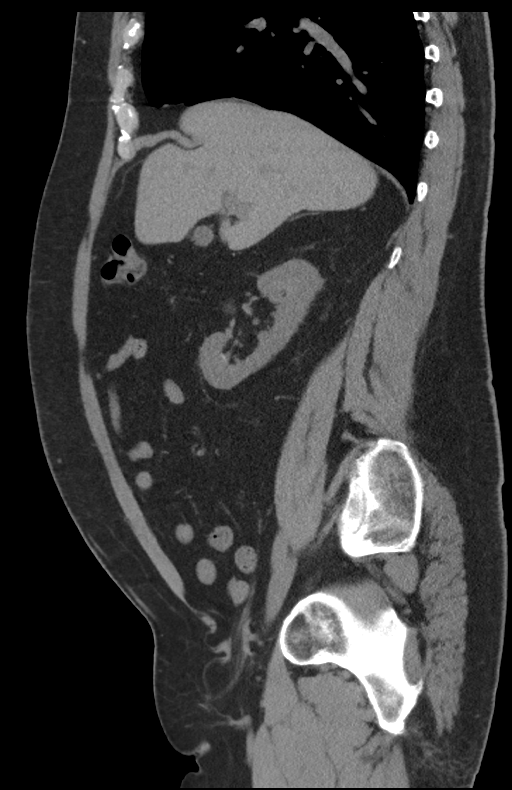
[im 115/149  soft-tissue]
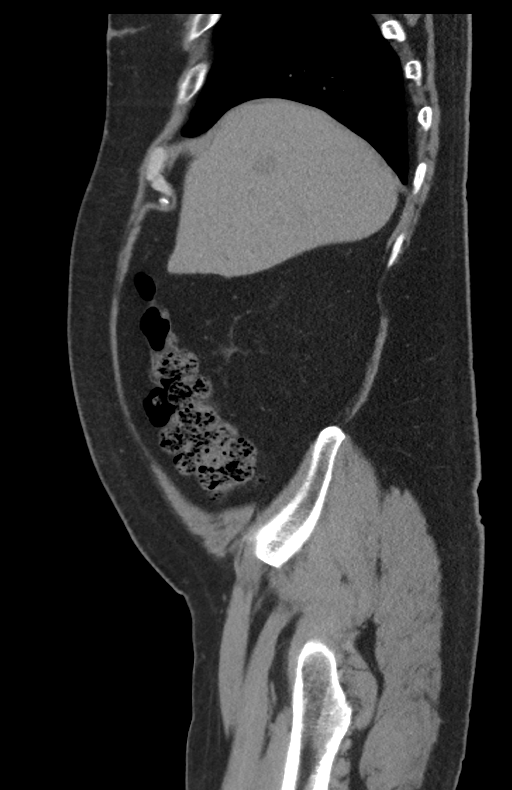
[im 129/149  lung]
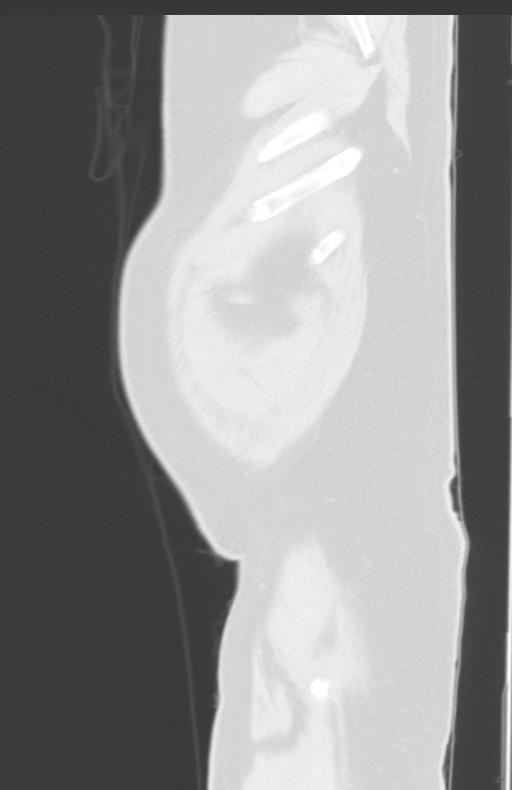
[im 134/149  soft-tissue]
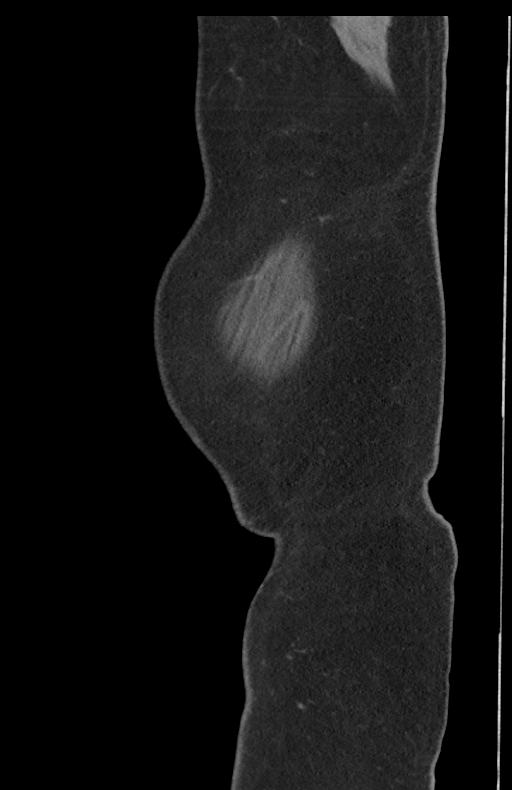
[im 134/149  lung]
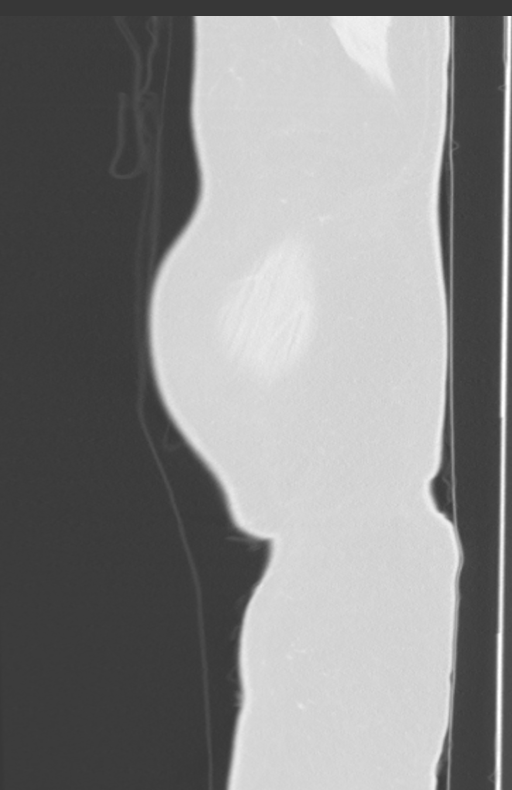
[im 139/149  lung]
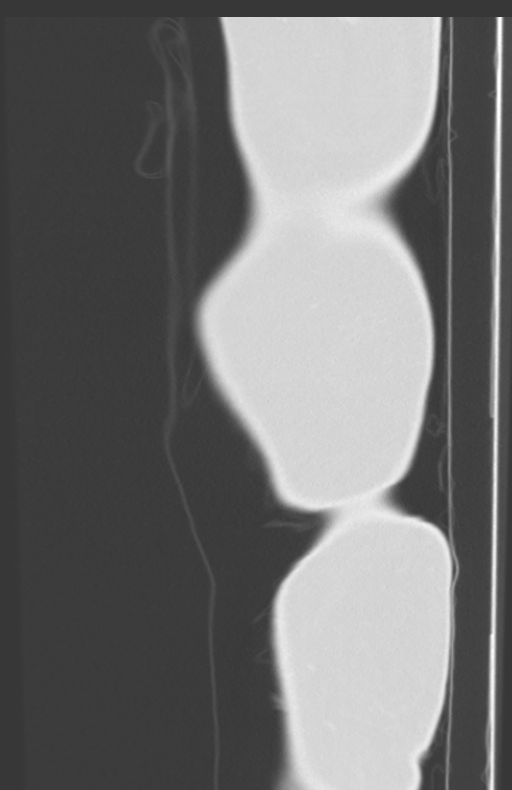
[im 144/149  lung]
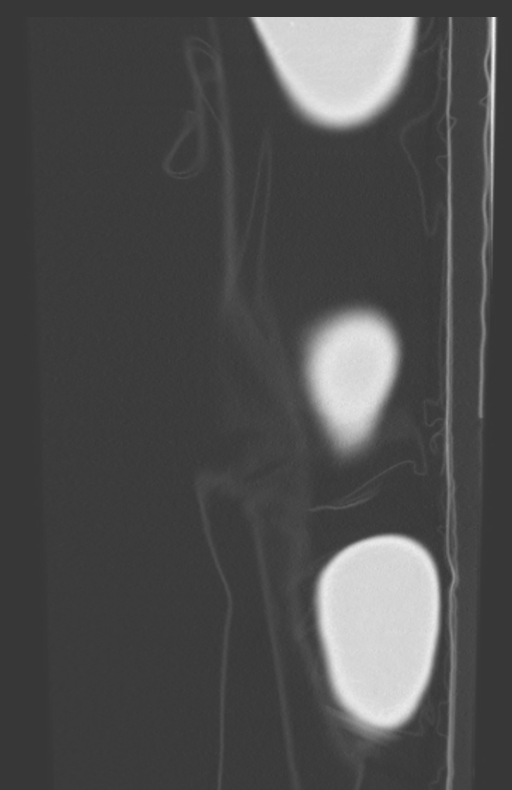

[10 of 34 positions shown; findings below may reference images not displayed]

FINDINGS: Lower chest: Lung bases are clear.

Hepatobiliary: Low-density lesion LEFT hepatic lobe measuring 1.7 cm
cannot be fully characterize on noncontrast exam. Lesion not clearly
seen on comparison noncontrast CT. Several gallstones noted. No
biliary duct dilatation.

Pancreas: Pancreas is normal. No ductal dilatation. No pancreatic
inflammation.

Spleen: Normal spleen

Adrenals/urinary tract: Adrenal glands normal. Mild hydroureter on
the LEFT secondary to obstructing calculus in the distal LEFT ureter
this calculus measures 5 mm and is approximately 1 cm from the LEFT
vascular ureteral junction. Approximately 4 distal calculi within
the RIGHT kidney measuring size from 1-3 mm. No RIGHT
ureterolithiasis. No bladder calculi.

Stomach/Bowel: Stomach, small bowel, appendix, and cecum are normal.
The colon and rectosigmoid colon are normal.

Vascular/Lymphatic: Abdominal aorta is normal caliber. No periportal
or retroperitoneal adenopathy. No pelvic adenopathy.

Reproductive: Prostate normal

Other: No free fluid.

Musculoskeletal: No aggressive osseous lesion.
IMPRESSION: 1. Obstructing calculus in the distal LEFT ureter with mild
hydroureter.
2. Multiple small RIGHT renal calculi.
3. Low-density lesion in the LEFT hepatic lobe cannot be fully
characterized on noncontrast exam. Recommend MRI with without
contrast for further characterization.

## 2022-02-11 ENCOUNTER — Encounter: Payer: Self-pay | Admitting: *Deleted

## 2022-05-12 ENCOUNTER — Emergency Department (HOSPITAL_COMMUNITY)
Admission: EM | Admit: 2022-05-12 | Discharge: 2022-05-12 | Disposition: A | Discharge status: Home / Self Care | Payer: PRIVATE HEALTH INSURANCE | Attending: Emergency Medicine | Admitting: Emergency Medicine

## 2022-05-12 ENCOUNTER — Encounter (HOSPITAL_COMMUNITY): Payer: Self-pay

## 2022-05-12 ENCOUNTER — Other Ambulatory Visit: Payer: Self-pay

## 2022-05-12 DIAGNOSIS — Z7982 Long term (current) use of aspirin: Secondary | ICD-10-CM | POA: Insufficient documentation

## 2022-05-12 DIAGNOSIS — N200 Calculus of kidney: Secondary | ICD-10-CM | POA: Insufficient documentation

## 2022-05-12 DIAGNOSIS — R109 Unspecified abdominal pain: Secondary | ICD-10-CM

## 2022-05-12 DIAGNOSIS — I10 Essential (primary) hypertension: Secondary | ICD-10-CM | POA: Diagnosis not present

## 2022-05-12 LAB — URINALYSIS, ROUTINE W REFLEX MICROSCOPIC
Bilirubin Urine: NEGATIVE
Glucose, UA: NEGATIVE mg/dL
Ketones, ur: 5 mg/dL — AB
Leukocytes,Ua: NEGATIVE
Nitrite: NEGATIVE
Protein, ur: 100 mg/dL — AB
Specific Gravity, Urine: 1.021 (ref 1.005–1.030)
pH: 5 (ref 5.0–8.0)

## 2022-05-12 MED ORDER — OXYCODONE HCL 5 MG PO TABS: 5.0000 mg | ORAL_TABLET | ORAL | 0 refills | Status: DC | PRN

## 2022-05-12 MED ORDER — TAMSULOSIN HCL 0.4 MG PO CAPS: 0.4000 mg | ORAL_CAPSULE | Freq: Every day | ORAL | 0 refills | Status: DC

## 2022-05-12 MED ORDER — IBUPROFEN 400 MG PO TABS
400.0000 mg | ORAL_TABLET | Freq: Once | ORAL | Status: AC
  Administered 2022-05-12: 400 mg via ORAL
  Filled 2022-05-12: qty 1

## 2022-05-12 MED ORDER — OXYCODONE-ACETAMINOPHEN 5-325 MG PO TABS: 1.0000 | ORAL_TABLET | Freq: Four times a day (QID) | ORAL | 0 refills | Status: DC | PRN

## 2022-05-12 NOTE — ED Provider Notes (Signed)
[facility] EMERGENCY DEPARTMENT Provider Note   CSN: [id number] Arrival date & time: 05/12/22  0444     History  Chief Complaint  Patient presents with   Nephrolithiasis    Clifford [name] is a 61 y.o. male.  The history is provided by the patient.  He has history of hypertension, hyperlipidemia, kidney stones and comes in complaining of right flank pain for the last 36 hours which is typical of his kidney stone pain.  Pain has radiated to the right lower abdomen.  He denies nausea or vomiting and denies fever or chills.  He denies dysuria or urinary urgency or frequency or tenesmus.  He has not noted any blood in his urine, but he did see his primary care provider who did a urinalysis and told him there was blood in it.  He was prescribed oxycodone-acetaminophen, but was unable to get it filled as his pharmacy stated that it was on backorder and he checked with several other pharmacies and found it was not available.  He did take an old oxycodone-acetaminophen which gave partial relief.  He has not taken anything else for pain.   Home Medications Prior to Admission medications   Medication Sig Start Date End Date Taking? Authorizing Provider  aspirin EC 81 MG tablet Take 81 mg by mouth daily.    [provider]  atorvastatin (LIPITOR) 10 MG tablet Take 10 mg by mouth at bedtime.     [provider]  influenza vac split quadrivalent PF (FLUARIX) 0.5 ML injection Inject into the muscle. 05/18/21   Carlyle Basques, MD  olmesartan (BENICAR) 20 MG tablet Take 20 mg by mouth daily.    [provider]  ondansetron (ZOFRAN ODT) 4 MG disintegrating tablet Take 1 tablet (4 mg total) by mouth every 8 (eight) hours as needed for nausea or vomiting. 06/11/19   Fransico Meadow, PA-C  oxyCODONE-acetaminophen (PERCOCET) 5-325 mg TABS tablet Take 6 tablets by mouth every 4 (four) hours as needed. 06/11/19   Fransico Meadow, PA-C  tamsulosin (FLOMAX) 0.4 MG CAPS capsule Take 1  capsule (0.4 mg total) by mouth daily. 06/11/19   Fransico Meadow, PA-C      Allergies    Hydrocodone    Review of Systems   Review of Systems  All other systems reviewed and are negative.   Physical Exam Updated Vital Signs BP 118/66   Pulse 65   Temp 98.6 F (37 C) (Oral)   Resp 18   SpO2 96%  Physical Exam Vitals and nursing note reviewed.   61 year old male, resting comfortably and in no acute distress. Vital signs are normal. Oxygen saturation is 96%, which is normal. Head is normocephalic and atraumatic. PERRLA, EOMI. Oropharynx is clear. Neck is nontender and supple without adenopathy or JVD. Back is nontender and there is no CVA tenderness. Lungs are clear without rales, wheezes, or rhonchi. Chest is nontender. Heart has regular rate and rhythm without murmur. Abdomen is soft, flat, nontender. Extremities have no cyanosis or edema, full range of motion is present. Skin is warm and dry without rash. Neurologic: Mental status is normal, cranial nerves are intact, moves all extremities equally  ED Results / Procedures / Treatments   Labs (all labs ordered are listed, but only abnormal results are displayed) Labs Reviewed - No data to display  Procedures Procedures    Medications Ordered in ED Medications - No data to display  ED Course/ Medical Decision Making/ A&P Clinical Course  as of 05/12/22 0741  Wed May 12, 2022  0707 Pending UA [MK]    Clinical Course User Index [MK] Kommor, Madison, MD                           Medical Decision Making Amount and/or Complexity of Data Reviewed Labs: ordered.  Risk Prescription drug management.   Right flank pain suspicious for urolithiasis.  Differential diagnosis does include pyelonephritis, diverticulitis, cholecystitis, pancreatitis.  Old records reviewed, and he did have an ED visit on 06/11/2019 for left flank pain which was found to be from a left ureteral calculus.  CT scan at that time showed several  right renal calculi present.  I have ordered a urinalysis, but CT scanner is currently unavailable.  I checked his narcotic prescription record, and he has not not had any narcotic prescriptions in the last 3 years.  Urinalysis is still pending.  Case is signed out to Dr. Matilde Sprang.  Final Clinical Impression(s) / ED Diagnoses Final diagnoses:  Right flank pain    Rx / DC Orders ED Discharge Orders          Ordered    tamsulosin (FLOMAX) 0.4 MG CAPS capsule  Daily        05/12/22 0529    oxyCODONE (ROXICODONE) 5 MG immediate release tablet  Every 4 hours PRN        05/12/22 0529    oxyCODONE-acetaminophen (PERCOCET/ROXICET) 5-325 MG tablet  Every 6 hours PRN        05/12/22 9969              Delora Fuel, MD 24/93/24 [phone]

## 2022-05-12 NOTE — ED Triage Notes (Signed)
Pov from home. Cc of needing pain medication for right flank pain. Went to pcp and was given script for pain meds- Percocet- but when he went to get them he was told they were out of stock. Says he really just needs some pain meds to get him through the next couple days because he is supposed to go out of town this am.

## 2022-05-12 NOTE — ED Notes (Signed)
Pre-pack given to pt, pt states he will not take medication until he gets home--as he drove himself here

## 2022-05-12 NOTE — ED Provider Notes (Signed)
  Physical Exam  BP 124/83 (BP Location: Left Arm)   Pulse 68   Temp 98.6 F (37 C) (Oral)   Resp 17   SpO2 100%   Physical Exam Constitutional:      General: He is not in acute distress.    Appearance: Normal appearance.  HENT:     Head: Normocephalic and atraumatic.     Nose: No congestion or rhinorrhea.  Eyes:     General:        Right eye: No discharge.        Left eye: No discharge.     Extraocular Movements: Extraocular movements intact.     Pupils: Pupils are equal, round, and reactive to light.  Cardiovascular:     Rate and Rhythm: Normal rate and regular rhythm.     Heart sounds: No murmur heard. Pulmonary:     Effort: No respiratory distress.     Breath sounds: No wheezing or rales.  Abdominal:     General: There is no distension.     Tenderness: There is no abdominal tenderness.  Musculoskeletal:        General: Normal range of motion.     Cervical back: Normal range of motion.  Skin:    General: Skin is warm and dry.  Neurological:     General: No focal deficit present.     Mental Status: He is alert.     Procedures  Procedures  ED Course / MDM   Clinical Course as of 05/12/22 0746  Wed May 12, 2022  0707 Pending UA [MK]    Clinical Course User Index [MK] Kathern Lobosco, Debe Coder, MD   Medical Decision Making Amount and/or Complexity of Data Reviewed Labs: ordered.  Risk Prescription drug management.   Patient received in handoff.  Concern for nephrolithiasis.  The CT scanner is currently nonfunctional here in the emergency department and we had a significant bedside shared decision-making discussion about his medical care today.  I explained that there is a chance that we are missing critical pathology in his abdomen by assuming that the patient has a stone today, and the patient voiced understanding of this.  He states that this feels identical to his previous kidney stones and the only reason he came to the emergency department today was for pain  control because his pharmacy was out of his pain medication.  He is nontoxic in appearance with stable vital signs and I did offer an ultrasound of which the patient declined.  His urinalysis was pending at time of signout that shows 6-10 red blood cells, 6-10 white blood cells but no nitrite or leuk esterase.  Patient given resources to follow-up outpatient with urology and strict return precautions to return the emergency department if symptoms worsen or change as we are assuming that our imaging capability will be back up and running likely by the end of the day.  Patient then discharged       Teressa Lower, MD 05/12/22 [phone]

## 2022-05-13 MED FILL — Oxycodone w/ Acetaminophen Tab 5-325 MG: ORAL | Qty: 6 | Status: AC

## 2024-05-10 DIAGNOSIS — H3581 Retinal edema: Secondary | ICD-10-CM | POA: Diagnosis not present

## 2024-05-15 ENCOUNTER — Encounter (HOSPITAL_COMMUNITY): Payer: Self-pay

## 2024-05-15 ENCOUNTER — Observation Stay (HOSPITAL_COMMUNITY)
Admission: EM | Admit: 2024-05-15 | Discharge: 2024-05-16 | Disposition: A | Discharge status: Home / Self Care | Source: Ambulatory Visit | Attending: Internal Medicine | Admitting: Internal Medicine

## 2024-05-15 ENCOUNTER — Emergency Department (HOSPITAL_COMMUNITY)

## 2024-05-15 ENCOUNTER — Other Ambulatory Visit: Payer: Self-pay

## 2024-05-15 DIAGNOSIS — I10 Essential (primary) hypertension: Secondary | ICD-10-CM | POA: Diagnosis not present

## 2024-05-15 DIAGNOSIS — H53461 Homonymous bilateral field defects, right side: Principal | ICD-10-CM | POA: Insufficient documentation

## 2024-05-15 DIAGNOSIS — Z79899 Other long term (current) drug therapy: Secondary | ICD-10-CM | POA: Insufficient documentation

## 2024-05-15 DIAGNOSIS — E785 Hyperlipidemia, unspecified: Secondary | ICD-10-CM | POA: Insufficient documentation

## 2024-05-15 DIAGNOSIS — I7121 Aneurysm of the ascending aorta, without rupture: Secondary | ICD-10-CM | POA: Diagnosis not present

## 2024-05-15 DIAGNOSIS — H539 Unspecified visual disturbance: Secondary | ICD-10-CM | POA: Diagnosis not present

## 2024-05-15 DIAGNOSIS — H5347 Heteronymous bilateral field defects: Secondary | ICD-10-CM | POA: Diagnosis not present

## 2024-05-15 DIAGNOSIS — R9082 White matter disease, unspecified: Secondary | ICD-10-CM | POA: Diagnosis not present

## 2024-05-15 DIAGNOSIS — Z7982 Long term (current) use of aspirin: Secondary | ICD-10-CM | POA: Insufficient documentation

## 2024-05-15 DIAGNOSIS — H538 Other visual disturbances: Secondary | ICD-10-CM | POA: Diagnosis not present

## 2024-05-15 DIAGNOSIS — Z23 Encounter for immunization: Secondary | ICD-10-CM | POA: Diagnosis not present

## 2024-05-15 DIAGNOSIS — R29818 Other symptoms and signs involving the nervous system: Secondary | ICD-10-CM | POA: Diagnosis not present

## 2024-05-15 LAB — CBC
HCT: 44.2 % (ref 39.0–52.0)
Hemoglobin: 14.5 g/dL (ref 13.0–17.0)
MCH: 28.8 pg (ref 26.0–34.0)
MCHC: 32.8 g/dL (ref 30.0–36.0)
MCV: 87.7 fL (ref 80.0–100.0)
Platelets: 180 K/uL (ref 150–400)
RBC: 5.04 MIL/uL (ref 4.22–5.81)
RDW: 13.2 % (ref 11.5–15.5)
WBC: 8 K/uL (ref 4.0–10.5)
nRBC: 0 % (ref 0.0–0.2)

## 2024-05-15 LAB — BASIC METABOLIC PANEL WITH GFR
Anion gap: 11 (ref 5–15)
BUN: 17 mg/dL (ref 8–23)
CO2: 24 mmol/L (ref 22–32)
Calcium: 9.3 mg/dL (ref 8.9–10.3)
Chloride: 108 mmol/L (ref 98–111)
Creatinine, Ser: 1.1 mg/dL (ref 0.61–1.24)
GFR, Estimated: >60 mL/min (ref >60)
Glucose, Bld: 100 mg/dL — ABNORMAL HIGH (ref 70–99)
Potassium: 4.3 mmol/L (ref 3.5–5.1)
Sodium: 142 mmol/L (ref 135–145)

## 2024-05-15 MED ORDER — GADOBUTROL 1 MMOL/ML IV SOLN
10.0000 mL | Freq: Once | INTRAVENOUS | Status: AC | PRN
  Administered 2024-05-15: 10 mL via INTRAVENOUS

## 2024-05-15 MED ORDER — TETRACAINE HCL 0.5 % OP SOLN: 2.0000 [drp] | Freq: Once | OPHTHALMIC | Status: DC

## 2024-05-15 MED ORDER — LORAZEPAM 0.5 MG PO TABS
0.5000 mg | ORAL_TABLET | Freq: Once | ORAL | Status: AC
  Administered 2024-05-15: 0.5 mg via ORAL

## 2024-05-15 NOTE — ED Triage Notes (Signed)
 Pt to er, pt states that he went to go see the eye doctor on Thursday for some blurry vision, states that his eye doctor talked to another eye doctor and they think he may have had a stroke behind his eye.  Denies confusion, denies weakness.  Pt states that it is like there is a curtain in his R eye.

## 2024-05-15 NOTE — ED Notes (Signed)
 Patient transported to MRI

## 2024-05-15 NOTE — ED Provider Notes (Signed)
 Patient signed out to myself at shift change pending MRI brain and orbits.  Of note patient has been experiencing right sided visual field cuts for approximate the past 10 days.  He has been evaluated by ophthalmology as well as a retinal specialist and was sent to emergency department for further evaluation for possible CVA.  Patient notes that he has had no other associated numbness, paresthesias or unilateral weakness.  He denies any history of similar symptoms in the past. Physical Exam  BP 108/68   Pulse 74   Temp 98.4 F (36.9 C) (Oral)   Resp 14   Ht 5' 6 (1.676 m)   Wt 99.8 kg   SpO2 97%   BMI 35.51 kg/m   Physical Exam  Procedures  Procedures  ED Course / MDM    Medical Decision Making Amount and/or Complexity of Data Reviewed Labs: ordered. Radiology: ordered.  Risk Prescription drug management. Decision regarding hospitalization.   Patient does remain stable at this time.  Discussed with patient that MRI brain and MRI orbits were unremarkable.  He has already been fully evaluated by ophthalmology on an outpatient basis.  Neurology did recommend admission for further TIA workup at this point and for consult with inpatient neurology in the morning.  Patient is in agreement to this plan at this time.  Did discuss patient case with Dr. Charlton with the hospitalist service who has excepted for admission.       Daralene Lonni BIRCH, PA-C 05/15/24 2235    Suzette Pac, MD 05/18/24 1419

## 2024-05-15 NOTE — ED Provider Notes (Addendum)
 [facility] AT [facility] Provider Note   CSN: [id number] Arrival date & time: 05/15/24  1412     Patient presents with: Blurred Vision   Clifford [name] is a 63 y.o. male presenting with a 10 day history of blurred vision in his right eye.  He states he woke 1 morning and noticed that he had decreased vision described as a blurred vision in his right upper field of vision which is most noticeable when he closes his left eye.  He denies eye pain, injury, no prior history of ophthalmic problems.  He does wear reading glasses only, he saw his optometrist 5 days ago who completed a full exam including taking pictures of his retinas which he sent to Dr. Alvia, retinologist in [place] who called today and suggested he come to the ed to rule out cva.  The optometrist felt he had a pale lower right retina based on his written report at the bedside, it is less clear if Dr. Alvia agreed with this assessment.He denies eye pain, tearing,  red eyes, photophobia, drainage.   The history is provided by the patient and the spouse.       Prior to Admission medications   Medication Sig Start Date End Date Taking? Authorizing Provider  aspirin EC 81 MG tablet Take 81 mg by mouth daily.    [provider]  atorvastatin (LIPITOR) 10 MG tablet Take 10 mg by mouth at bedtime.     [provider]  influenza vac split quadrivalent PF (FLUARIX) 0.5 ML injection Inject into the muscle. 05/18/21   Luiz Channel, MD  olmesartan (BENICAR) 20 MG tablet Take 20 mg by mouth daily.    [provider]  ondansetron  (ZOFRAN  ODT) 4 MG disintegrating tablet Take 1 tablet (4 mg total) by mouth every 8 (eight) hours as needed for nausea or vomiting. 06/11/19   Sofia, Leslie K, PA-C  oxyCODONE  (ROXICODONE ) 5 MG immediate release tablet Take 1 tablet (5 mg total) by mouth every 4 (four) hours as needed for severe pain. 05/12/22   Raford Lenis, MD   oxyCODONE -acetaminophen  (PERCOCET/ROXICET) 5-325 MG tablet Take 1 tablet by mouth every 6 (six) hours as needed for severe pain. 05/12/22   Raford Lenis, MD  tamsulosin  (FLOMAX ) 0.4 MG CAPS capsule Take 1 capsule (0.4 mg total) by mouth daily. 05/12/22   Raford Lenis, MD    Allergies: Hydrocodone    Review of Systems  Constitutional:  Negative for fever.  HENT:  Negative for congestion.   Eyes:  Positive for visual disturbance. Negative for photophobia, pain and redness.  Respiratory:  Negative for chest tightness and shortness of breath.   Cardiovascular:  Negative for chest pain.  Gastrointestinal:  Negative for abdominal pain, nausea and vomiting.  Genitourinary: Negative.   Musculoskeletal:  Negative for arthralgias, joint swelling and neck pain.  Skin: Negative.  Negative for rash and wound.  Neurological:  Negative for dizziness, weakness, light-headedness, numbness and headaches.  Psychiatric/Behavioral: Negative.      Updated Vital Signs BP 138/87   Pulse 98   Temp 98.4 F (36.9 C) (Oral)   Resp 18   Ht 5' 6 (1.676 m)   Wt 99.8 kg   SpO2 98%   BMI 35.51 kg/m   Physical Exam Vitals and nursing note reviewed.  Constitutional:      Appearance: He is well-developed.  HENT:     Head: Normocephalic and atraumatic.  Eyes:     General: Lids are  normal. Visual field deficit present.     Extraocular Movements: Extraocular movements intact.     Conjunctiva/sclera: Conjunctivae normal.     Pupils: Pupils are equal, round, and reactive to light.     Visual Fields: Left eye visual fields normal.     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 DM in the upper nasal quadrant.  Cardiovascular:     Rate and Rhythm: Normal rate.  Pulmonary:     Effort: Pulmonary effort is normal.  Musculoskeletal:        General: Normal range of motion.     Cervical back: Normal range of motion.  Skin:    General: Skin is warm and dry.  Neurological:     General: No focal deficit  present.     Mental Status: He is alert and oriented to person, place, and time.     (all labs ordered are listed, but only abnormal results are displayed) Labs Reviewed - No data to display  EKG: None  Radiology: No results found.   Procedures   Medications Ordered in the ED  tetracaine (PONTOCAINE) 0.5 % ophthalmic solution 2 drop (has no administration in time range)                                    Medical Decision Making Pt with a 10 day hx of right hemianopia with upper field vision loss concerning for central visual cortex cva vs retinal artery occlusion - possibly more likely given unilateral sx.  Labs and MR imaging pending at this time.  Pt concerned for possible intolerance to MRI, ativan 0.5 mg PO given.   Discussed with Medford Conger PA-C who assumes pt care.   Amount and/or Complexity of Data Reviewed Labs: ordered. Radiology: ordered. Discussion of management or test interpretation with external provider(s): Discussed with Dr. Lindzen with neurology regarding pt sx and results of ophthalmology w/u and recs.  Agrees pt would benefit from admission for cva work up,  recommends getting MR brain and MR orbits w/wo contrast now,  echo, risk start/ with admission.   Risk Prescription drug management. Decision regarding hospitalization.        Final diagnoses:  Unilateral hemianopia, right    ED Discharge Orders     None          Birdena Mliss RIGGERS 05/15/24 1933    Birdena Mliss, PA-C 05/15/24 1947    Suzette Pac, MD 05/18/24 [phone]

## 2024-05-16 ENCOUNTER — Observation Stay (HOSPITAL_COMMUNITY)

## 2024-05-16 ENCOUNTER — Other Ambulatory Visit (HOSPITAL_COMMUNITY)

## 2024-05-16 ENCOUNTER — Other Ambulatory Visit (HOSPITAL_COMMUNITY): Payer: Self-pay | Admitting: *Deleted

## 2024-05-16 ENCOUNTER — Encounter (HOSPITAL_COMMUNITY): Payer: Self-pay | Admitting: Family Medicine

## 2024-05-16 DIAGNOSIS — E785 Hyperlipidemia, unspecified: Secondary | ICD-10-CM | POA: Diagnosis present

## 2024-05-16 DIAGNOSIS — H538 Other visual disturbances: Secondary | ICD-10-CM | POA: Diagnosis not present

## 2024-05-16 DIAGNOSIS — I7121 Aneurysm of the ascending aorta, without rupture: Secondary | ICD-10-CM | POA: Diagnosis not present

## 2024-05-16 DIAGNOSIS — I6523 Occlusion and stenosis of bilateral carotid arteries: Secondary | ICD-10-CM | POA: Diagnosis not present

## 2024-05-16 DIAGNOSIS — I10 Essential (primary) hypertension: Secondary | ICD-10-CM | POA: Diagnosis present

## 2024-05-16 DIAGNOSIS — I6782 Cerebral ischemia: Secondary | ICD-10-CM | POA: Diagnosis not present

## 2024-05-16 DIAGNOSIS — I672 Cerebral atherosclerosis: Secondary | ICD-10-CM | POA: Diagnosis not present

## 2024-05-16 LAB — HIV ANTIBODY (ROUTINE TESTING W REFLEX): HIV Screen 4th Generation wRfx: NONREACTIVE

## 2024-05-16 LAB — CBC
HCT: 42.6 % (ref 39.0–52.0)
Hemoglobin: 13.9 g/dL (ref 13.0–17.0)
MCH: 29.1 pg (ref 26.0–34.0)
MCHC: 32.6 g/dL (ref 30.0–36.0)
MCV: 89.1 fL (ref 80.0–100.0)
Platelets: 158 K/uL (ref 150–400)
RBC: 4.78 MIL/uL (ref 4.22–5.81)
RDW: 13.2 % (ref 11.5–15.5)
WBC: 7.8 K/uL (ref 4.0–10.5)
nRBC: 0 % (ref 0.0–0.2)

## 2024-05-16 LAB — LIPID PANEL
Cholesterol: 153 mg/dL (ref 0–200)
HDL: 39 mg/dL — ABNORMAL LOW (ref >40)
LDL Cholesterol: 95 mg/dL (ref 0–99)
Total CHOL/HDL Ratio: 4 ratio
Triglycerides: 97 mg/dL (ref <150)
VLDL: 19 mg/dL (ref 0–40)

## 2024-05-16 LAB — BASIC METABOLIC PANEL WITH GFR
Anion gap: 10 (ref 5–15)
BUN: 15 mg/dL (ref 8–23)
CO2: 22 mmol/L (ref 22–32)
Calcium: 8.7 mg/dL — ABNORMAL LOW (ref 8.9–10.3)
Chloride: 107 mmol/L (ref 98–111)
Creatinine, Ser: 0.93 mg/dL (ref 0.61–1.24)
GFR, Estimated: >60 mL/min (ref >60)
Glucose, Bld: 106 mg/dL — ABNORMAL HIGH (ref 70–99)
Potassium: 3.9 mmol/L (ref 3.5–5.1)
Sodium: 139 mmol/L (ref 135–145)

## 2024-05-16 LAB — HEMOGLOBIN A1C
Hgb A1c MFr Bld: 5.4 % (ref 4.8–5.6)
Mean Plasma Glucose: 108.28 mg/dL

## 2024-05-16 MED ORDER — ACETAMINOPHEN 325 MG PO TABS: 650.0000 mg | ORAL_TABLET | ORAL | Status: DC | PRN

## 2024-05-16 MED ORDER — ACETAMINOPHEN 160 MG/5ML PO SOLN: 650.0000 mg | ORAL | Status: DC | PRN

## 2024-05-16 MED ORDER — ATORVASTATIN CALCIUM 10 MG PO TABS
10.0000 mg | ORAL_TABLET | Freq: Every day | ORAL | Status: DC
Start: 2024-05-16 — End: 2024-05-16
  Administered 2024-05-16: 10 mg via ORAL
  Filled 2024-05-16: qty 1

## 2024-05-16 MED ORDER — ENOXAPARIN SODIUM 40 MG/0.4ML IJ SOSY
40.0000 mg | PREFILLED_SYRINGE | INTRAMUSCULAR | Status: DC
  Filled 2024-05-16: qty 0.4

## 2024-05-16 MED ORDER — CLOPIDOGREL BISULFATE 75 MG PO TABS: 75.0000 mg | ORAL_TABLET | Freq: Every day | ORAL | 0 refills | Status: DC

## 2024-05-16 MED ORDER — ASPIRIN 81 MG PO TBEC
81.0000 mg | DELAYED_RELEASE_TABLET | Freq: Every day | ORAL | Status: DC
  Administered 2024-05-16: 81 mg via ORAL
  Filled 2024-05-16: qty 1

## 2024-05-16 MED ORDER — IOHEXOL 350 MG/ML SOLN
75.0000 mL | Freq: Once | INTRAVENOUS | Status: AC | PRN
  Administered 2024-05-16: 75 mL via INTRAVENOUS

## 2024-05-16 MED ORDER — CLOPIDOGREL BISULFATE 75 MG PO TABS
75.0000 mg | ORAL_TABLET | Freq: Every day | ORAL | Status: DC
  Filled 2024-05-16: qty 1

## 2024-05-16 MED ORDER — STROKE: EARLY STAGES OF RECOVERY BOOK
Freq: Once | Status: DC
  Filled 2024-05-16: qty 1

## 2024-05-16 MED ORDER — IRBESARTAN 150 MG PO TABS
150.0000 mg | ORAL_TABLET | Freq: Every day | ORAL | Status: DC
  Administered 2024-05-16: 150 mg via ORAL
  Filled 2024-05-16: qty 1

## 2024-05-16 MED ORDER — ACETAMINOPHEN 650 MG RE SUPP: 650.0000 mg | RECTAL | Status: DC | PRN

## 2024-05-16 MED ORDER — SENNOSIDES-DOCUSATE SODIUM 8.6-50 MG PO TABS: 1.0000 | ORAL_TABLET | Freq: Every evening | ORAL | Status: DC | PRN

## 2024-05-16 MED ORDER — SODIUM CHLORIDE 0.9 % IV SOLN: INTRAVENOUS | Status: DC

## 2024-05-16 MED ORDER — POLYVINYL ALCOHOL 1.4 % OP SOLN
1.0000 [drp] | Freq: Three times a day (TID) | OPHTHALMIC | Status: DC
  Administered 2024-05-16: 1 [drp] via OPHTHALMIC
  Filled 2024-05-16: qty 15

## 2024-05-16 NOTE — ED Notes (Signed)
 Pt in bed, sig other at bedside, pt states that he has no pain and he is ready and wants to go home, read and reviewed d/c instructions, discussed follow up, pt verbalized understanding, pt ambulatory from department with sig other with steady gait.

## 2024-05-16 NOTE — ED Notes (Signed)
 Patient transported to CT

## 2024-05-16 NOTE — Evaluation (Signed)
 Occupational Therapy Evaluation Patient Details Name: Clifford [name] MRN: [id number] DOB: March 08, 1961 Today's Date: 05/16/2024   History of Present Illness   Clifford [name] is a 63 y.o. male with medical history significant for hypertension, hyperlipidemia, and nephrolithiasis who presents for evaluation of visual disturbance involving his right eye.     Patient reports that he was in his usual state until the morning of 05/05/2024 when he woke with vision disturbance.  He describes feeling as though a curtain was hanging over the top part of his right eye.  Symptoms may have improved slightly but persist.  There has not been any associated headache, change in hearing, or focal numbness or weakness.  He denies any recent chest pain or palpitations.     Patient states that ophthalmology was concerned for a stroke and recommended that he seek further evaluation in the ED. (per MD)     Clinical Impressions Pt agreeable to OT and PT co-evaluation. Pt appears to be at or near baseline function other than reports of seeing a stain in the upper R quadrant of his vision in the R eye. Pt tested well in all visual screening tasks today, but reports this stain still persists. Pt able to ambulate and care for himself independently. Recommended to follow up with a neuro vision specialist for a more in depth evaluation. Pt is not recommended for any further acute OT services and will be discharged to care of nursing staff for remaining length of stay.       If plan is discharge home, recommend the following:   Assist for transportation     Functional Status Assessment   Patient has not had a recent decline in their functional status     Equipment Recommendations   None recommended by OT     Recommendations for Other Services   Other (comment) (Evaluation from neuro vision specialist.)     Precautions/Restrictions   Precautions Precautions: None Restrictions Weight Bearing Restrictions Per  Provider Order: No     Mobility Bed Mobility Overal bed mobility: Independent                  Transfers Overall transfer level: Independent                 General transfer comment: Ambulated without AD in the hall.      Balance Overall balance assessment: Independent                                         ADL either performed or assessed with clinical judgement   ADL                                         General ADL Comments: Pt already seated at EOB to start session.     Vision Baseline Vision/History: 1 Wears glasses Ability to See in Adequate Light: 1 Impaired Patient Visual Report: Other (comment) (Reports that it looks as though there is a stain in the upper right quadrant of his vision that is still present.) Vision Assessment?: Yes Eye Alignment: Within Functional Limits Tracking/Visual Pursuits: Able to track stimulus in all quads without difficulty Convergence: Within functional limits Visual Fields: No apparent deficits Additional Comments: No obvious impairments other than pt report. Good tracking and star test  for visual fields/perception.     Perception Perception: Not tested       Praxis Praxis: Not tested       Pertinent Vitals/Pain Pain Assessment Pain Assessment: No/denies pain     Extremity/Trunk Assessment Upper Extremity Assessment Upper Extremity Assessment: Overall WFL for tasks assessed   Lower Extremity Assessment Lower Extremity Assessment: Defer to PT evaluation   Cervical / Trunk Assessment Cervical / Trunk Assessment: Normal   Communication Communication Communication: No apparent difficulties   Cognition Arousal: Alert Behavior During Therapy: WFL for tasks assessed/performed Cognition: No apparent impairments                               Following commands: Intact       Cueing  General Comments   Cueing Techniques: Verbal cues                  Home Living Family/patient expects to be discharged to:: Private residence Living Arrangements: Spouse/significant other Available Help at Discharge: Family Type of Home: House Home Access: Stairs to enter Secretary/administrator of Steps: 3 Entrance Stairs-Rails: Right Home Layout: One level     Bathroom Shower/Tub: Producer, television/film/video: Handicapped height Bathroom Accessibility: Yes How Accessible: Accessible via wheelchair;Accessible via walker Home Equipment: Agricultural consultant (2 wheels);Cane - single point;Shower seat;Grab bars - tub/shower          Prior Functioning/Environment Prior Level of Function : Independent/Modified Independent             Mobility Comments: Independent ADLs Comments: Independent                            Co-evaluation PT/OT/SLP Co-Evaluation/Treatment: Yes Reason for Co-Treatment: To address functional/ADL transfers   OT goals addressed during session: ADL's and self-care      AM-PAC OT 6 Clicks Daily Activity     Outcome Measure Help from another [name] eating meals?: None Help from another [name] taking care of personal grooming?: None Help from another [name] toileting, which includes using toliet, bedpan, or urinal?: None Help from another [name] bathing (including washing, rinsing, drying)?: None Help from another [name] to put on and taking off regular upper body clothing?: None Help from another [name] to put on and taking off regular lower body clothing?: None 6 Click Score: 24   End of Session    Activity Tolerance: Patient tolerated treatment well Patient left: in bed;with call bell/phone within reach;with family/visitor present  OT Visit Diagnosis: Other symptoms and signs involving the nervous system (M70.101)                Time: 9141-9091 OT Time Calculation (min): 10 min Charges:  OT General Charges $OT Visit: 1 Visit OT Evaluation $OT Eval Low Complexity: 1 Low  Clifford [name] OT, MOT  Clifford [name] 05/16/2024, 9:42 AM

## 2024-05-16 NOTE — Consult Note (Signed)
 SLP Cancellation Note  Patient Details Name: Clifford [name] MRN: [id number] DOB: [birth year]   Cancelled treatment:       Reason Eval/Treat Not Completed: SLP screened, no needs identified, will sign off   Pat Navi Ewton,M.S.,CCC-SLP 05/16/2024, 10:29 AM

## 2024-05-16 NOTE — H&P (Signed)
 History and Physical    Clifford [name]:[id number] DOB: [birth year] DOA: 05/15/2024  PCP: Marvine Rush, MD   Patient coming from: Home   Chief Complaint: Vision disturbance   HPI: Clifford [name] is a 63 y.o. male with medical history significant for hypertension, hyperlipidemia, and nephrolithiasis who presents for evaluation of visual disturbance involving his right eye.  Patient reports that he was in his usual state until the morning of 05/05/2024 when he woke with vision disturbance.  He describes feeling as though a curtain was hanging over the top part of his right eye.  Symptoms may have improved slightly but persist.  There has not been any associated headache, change in hearing, or focal numbness or weakness.  He denies any recent chest pain or palpitations.  Patient states that ophthalmology was concerned for a stroke and recommended that he seek further evaluation in the ED.  ED Course: Upon arrival to the ED, patient is found to be afebrile and saturating well on room air with normal HR and stable BP.  Chemistry panel and CBC are unremarkable and there are no acute findings on MRI brain or MRI orbits.  Neurology (Dr. Merrianne) was consulted by the ED PA and recommended admission for TIA/CVA workup.  Review of Systems:  All other systems reviewed and apart from HPI, are negative.  Past Medical History:  Diagnosis Date   History of kidney stones    Hyperlipidemia    Hypertension     Past Surgical History:  Procedure Laterality Date   COLONOSCOPY N/A 03/18/2017   Procedure: COLONOSCOPY;  Surgeon: Shaaron Lamar HERO, MD;  Location: [facility];  Service: Endoscopy;  Laterality: N/A;  11:30 AM    Social History:   reports that he has never smoked. He has never used smokeless tobacco. He reports that he does not drink alcohol and does not use drugs.  Allergies  Allergen Reactions   Codeine Nausea Only   Hydrocodone Nausea Only    Family History  Problem Relation Age of  Onset   Cancer Mother    Heart Problems Mother    Heart attack Father    Stroke Other      Prior to Admission medications   Medication Sig Start Date End Date Taking? Authorizing Provider  aspirin EC 81 MG tablet Take 81 mg by mouth daily.   Yes [provider]  atorvastatin (LIPITOR) 10 MG tablet Take 10 mg by mouth at bedtime.    Yes [provider]  olmesartan (BENICAR) 20 MG tablet Take 20 mg by mouth daily.   Yes [provider]  Polyethylene Glycol 400 (BLINK TEARS OP) Place 1 drop into the right eye in the morning, at noon, and at bedtime.   Yes [provider]    Physical Exam: Vitals:   05/15/24 2236 05/15/24 2300 05/16/24 0000 05/16/24 0045  BP: 114/75 128/82 109/78 103/75  Pulse: 72 74 76 79  Resp:  18 17 17   Temp:      TempSrc:      SpO2: 93% 97% 96% 95%  Weight:      Height:        Constitutional: NAD, no pallor or diaphoresis   Eyes: PERTLA, lids and conjunctivae normal ENMT: Mucous membranes are moist. Posterior pharynx clear of any exudate or lesions.   Neck: supple, no masses  Respiratory: no wheezing, no crackles. No accessory muscle use.  Cardiovascular: S1 & S2 heard, regular rate and rhythm. No extremity edema.  Abdomen:  No tenderness, soft. Bowel sounds active.  Musculoskeletal: no clubbing / cyanosis. No joint deformity upper and lower extremities.   Skin: no significant rashes, lesions, ulcers. Warm, dry, well-perfused. Neurologic: Superior altitudinal hemianopia on the right, CN 2-12 grossly intact otherwise. Sensation to light touch intact. Strength 5/5 in all 4 limbs. Alert and oriented.  Psychiatric: Calm. Cooperative.    Labs and Imaging on Admission: I have personally reviewed following labs and imaging studies  CBC: Recent Labs  Lab 05/15/24 1910  WBC 8.0  HGB 14.5  HCT 44.2  MCV 87.7  PLT 180   Basic Metabolic Panel: Recent Labs  Lab 05/15/24 1910  NA 142  K 4.3  CL 108  CO2 24  GLUCOSE  100*  BUN 17  CREATININE 1.10  CALCIUM 9.3   GFR: Estimated Creatinine Clearance: 76 mL/min (by C-G formula based on SCr of 1.1 mg/dL). Liver Function Tests: No results for input(s): AST, ALT, ALKPHOS, BILITOT, PROT, ALBUMIN in the last 168 hours. No results for input(s): LIPASE, AMYLASE in the last 168 hours. No results for input(s): AMMONIA in the last 168 hours. Coagulation Profile: No results for input(s): INR, PROTIME in the last 168 hours. Cardiac Enzymes: No results for input(s): CKTOTAL, CKMB, CKMBINDEX, TROPONINI in the last 168 hours. BNP (last 3 results) No results for input(s): PROBNP in the last 8760 hours. HbA1C: No results for input(s): HGBA1C in the last 72 hours. CBG: No results for input(s): GLUCAP in the last 168 hours. Lipid Profile: No results for input(s): CHOL, HDL, LDLCALC, TRIG, CHOLHDL, LDLDIRECT in the last 72 hours. Thyroid Function Tests: No results for input(s): TSH, T4TOTAL, FREET4, T3FREE, THYROIDAB in the last 72 hours. Anemia Panel: No results for input(s): VITAMINB12, FOLATE, FERRITIN, TIBC, IRON, RETICCTPCT in the last 72 hours. Urine analysis:    Component Value Date/Time   COLORURINE YELLOW 05/12/2022 0609   APPEARANCEUR HAZY (A) 05/12/2022 0609   LABSPEC 1.021 05/12/2022 0609   PHURINE 5.0 05/12/2022 0609   GLUCOSEU NEGATIVE 05/12/2022 0609   HGBUR LARGE (A) 05/12/2022 0609   BILIRUBINUR NEGATIVE 05/12/2022 0609   KETONESUR 5 (A) 05/12/2022 0609   PROTEINUR 100 (A) 05/12/2022 0609   NITRITE NEGATIVE 05/12/2022 0609   LEUKOCYTESUR NEGATIVE 05/12/2022 0609   Sepsis Labs: @LABRCNTIP (procalcitonin:4,lacticidven:4) )No results found for this or any previous visit (from the past 240 hours).   Radiological Exams on Admission: MR BRAIN WO CONTRAST Result Date: 05/15/2024 EXAM: MRI BRAIN WITHOUT CONTRAST 05/15/2024 08:35:36 PM TECHNIQUE: Multiplanar multisequence MRI of  the head/brain was performed without the administration of intravenous contrast. COMPARISON: None available. CLINICAL HISTORY: Neuro deficit, acute, stroke suspected. right unilateral hemianopia. FINDINGS: BRAIN AND VENTRICLES: No acute infarct. No intracranial hemorrhage. No mass. No midline shift. No hydrocephalus. Mild scattered patchy T2 FLAIR signal abnormality seen involving the supratentorial superwhite matter, nonspecific, but most commonly related to chronic right facet ischemic disease. Overall, changes are mild for age. The sella is unremarkable. Normal flow voids. ORBITS: No acute abnormality. SINUSES AND MASTOIDS: No acute abnormality. BONES AND SOFT TISSUES: Normal marrow signal. No acute soft tissue abnormality. IMPRESSION: 1. No acute intracranial abnormality. 2. Mild cerebral white matter disease, nonspecific, but most commonly related to chronic microvascular ischemic disease. Electronically signed by: Morene Hoard MD 05/15/2024 09:51 PM EDT RP Workstation: [id number]   MR ORBITS W WO CONTRAST Result Date: 05/15/2024 EXAM: MRI ORBITS WITHOUT AND WITH CONTRAST 05/15/2024 08:35:36 PM TECHNIQUE: Multiplanar, multisequence MRI of the orbits was performed without and with intravenous contrast. 10  mL (gadobutrol (GADAVIST) 1 MMOL/ML injection 10 mL GADOBUTROL 1 MMOL/ML IV SOLN) was administered. COMPARISON: None available CLINICAL HISTORY: Vision impairment (Ped 0-17y); right unilateral hemianopia. FINDINGS: ORBITS: Globes are symmetric in size with normal appearance and morphology. Lenses are normally located. Optic nerves are symmetric and within normal limits. There is no abnormal edema or enhancement of either optic nerve or optic nerve sheath complex. Intraconal and extraconal fat is maintained. Extraocular muscles are symmetric and normal. Lacrimal glands are normal. There is no abnormality about the orbital apices or cavernous sinus. The optic chiasm is normally situated within the  suprasellar cistern. Orbital soft tissues are within normal limits. No orbital mass. No abnormal enhancement. SINUSES AND MASTOIDS: Mild mucosal thickening about the ethmoid cells and right maxillary sinus. Paranasal sinuses are otherwise clear. Prominent. BRAIN: No acute abnormality. BONES AND SOFT TISSUES: Normal bone marrow signal. No acute soft tissue abnormality. IMPRESSION: 1. Normal MRI of the orbits. No acute abnormality. No findings to explain patient symptoms. Electronically signed by: Morene Hoard MD 05/15/2024 09:41 PM EDT RP Workstation: [id number]    Assessment/Plan   1. Vision disturbance  - No acute findings on MRI brain or MRI orbits  - Keep NPO pending swallow screen, continue cardiac monitoring, check lipids, A1c, CTA head & neck, and TTE, continue ASA 81 and Lipitor daily, add Plavix 75 mg daily, consult PT/OT/SLP   2. Hypertension  - Continue ARB    3. HLD  - Check lipid panel, continue Lipitor    DVT prophylaxis: Lovenox  Code Status: Full  Level of Care: Level of care: Telemetry Family Communication: Wife at bedside  Disposition Plan:  Patient is from: home  Anticipated d/c is to: Home  Anticipated d/c date is: 10/15 or 05/17/24 Patient currently: Pending TIA workup  Consults called: Neurology  Admission status: Observation     Evalene GORMAN Sprinkles, MD [facility]  05/16/2024, 1:30 AM

## 2024-05-16 NOTE — Evaluation (Signed)
 Physical Therapy Evaluation Patient Details Name: Clifford [name] MRN: [id number] DOB: May 03, 1961 Today's Date: 05/16/2024  History of Present Illness  Clifford [name] is a 63 y.o. male with medical history significant for hypertension, hyperlipidemia, and nephrolithiasis who presents for evaluation of visual disturbance involving his right eye.     Patient reports that he was in his usual state until the morning of 05/05/2024 when he woke with vision disturbance.  He describes feeling as though a curtain was hanging over the top part of his right eye.  Symptoms may have improved slightly but persist.  There has not been any associated headache, change in hearing, or focal numbness or weakness.  He denies any recent chest pain or palpitations.     Patient states that ophthalmology was concerned for a stroke and recommended that he seek further evaluation in the ED. (per MD)   Clinical Impression  Patient functioning at baseline for functional mobility and gait other than c/o limited vision in his right eye, otherwise demonstrates good return for ambulating in room, hallway without loss of balance or need for an AD. Plan:  Patient discharged from physical therapy to care of nursing for ambulation daily as tolerated for length of stay.          If plan is discharge home, recommend the following: Help with stairs or ramp for entrance;Assistance with cooking/housework   Can travel by private vehicle        Equipment Recommendations None recommended by PT  Recommendations for Other Services       Functional Status Assessment Patient has not had a recent decline in their functional status     Precautions / Restrictions Precautions Precautions: None Restrictions Weight Bearing Restrictions Per Provider Order: No      Mobility  Bed Mobility Overal bed mobility: Independent                  Transfers Overall transfer level: Independent                       Ambulation/Gait Ambulation/Gait assistance: Independent   Assistive device: None Gait Pattern/deviations: WFL(Within Functional Limits) Gait velocity: normal     General Gait Details: grossly WFL with good return for ambulating in room, hallways without loss of balance or need for an AD  Stairs            Wheelchair Mobility     Tilt Bed    Modified Rankin (Stroke Patients Only)       Balance Overall balance assessment: Independent                                           Pertinent Vitals/Pain Pain Assessment Pain Assessment: No/denies pain    Home Living Family/patient expects to be discharged to:: Private residence Living Arrangements: Spouse/significant other Available Help at Discharge: Family Type of Home: House Home Access: Stairs to enter Entrance Stairs-Rails: Right Entrance Stairs-Number of Steps: 3   Home Layout: One level Home Equipment: Agricultural consultant (2 wheels);Cane - single point;Shower seat;Grab bars - tub/shower      Prior Function Prior Level of Function : Independent/Modified Independent             Mobility Comments: Independent ADLs Comments: Independent     Extremity/Trunk Assessment   Upper Extremity Assessment Upper Extremity Assessment: Defer to OT evaluation  Lower Extremity Assessment Lower Extremity Assessment: Overall WFL for tasks assessed    Cervical / Trunk Assessment Cervical / Trunk Assessment: Normal  Communication   Communication Communication: No apparent difficulties    Cognition Arousal: Alert Behavior During Therapy: WFL for tasks assessed/performed   PT - Cognitive impairments: No apparent impairments                         Following commands: Intact       Cueing Cueing Techniques: Verbal cues     General Comments      Exercises     Assessment/Plan    PT Assessment Patient does not need any further PT services  PT Problem List         PT  Treatment Interventions      PT Goals (Current goals can be found in the Care Plan section)  Acute Rehab PT Goals Patient Stated Goal: return home with family to assist PT Goal Formulation: With patient/family Time For Goal Achievement: 05/16/24 Potential to Achieve Goals: Good    Frequency       Co-evaluation PT/OT/SLP Co-Evaluation/Treatment: Yes Reason for Co-Treatment: To address functional/ADL transfers PT goals addressed during session: Mobility/safety with mobility;Balance OT goals addressed during session: ADL's and self-care       AM-PAC PT 6 Clicks Mobility  Outcome Measure Help needed turning from your back to your side while in a flat bed without using bedrails?: None Help needed moving from lying on your back to sitting on the side of a flat bed without using bedrails?: None Help needed moving to and from a bed to a chair (including a wheelchair)?: None Help needed standing up from a chair using your arms (e.g., wheelchair or bedside chair)?: None Help needed to walk in hospital room?: None Help needed climbing 3-5 steps with a railing? : None 6 Click Score: 24    End of Session   Activity Tolerance: Patient tolerated treatment well Patient left: in bed;with family/visitor present Nurse Communication: Mobility status PT Visit Diagnosis: Unsteadiness on feet (R26.81);Other abnormalities of gait and mobility (R26.89);Muscle weakness (generalized) (M62.81)    Time: 9146-9091 PT Time Calculation (min) (ACUTE ONLY): 15 min   Charges:   PT Evaluation $PT Eval Low Complexity: 1 Low PT Treatments $Therapeutic Activity: 8-22 mins PT General Charges $$ ACUTE PT VISIT: 1 Visit         11:36 AM, 05/16/24 Lynwood Music, MPT Physical Therapist with [facility] 336 [phone] office [phone] mobile phone

## 2024-05-16 NOTE — ED Notes (Signed)
 Pt sitting on side of bed, spouse at bedside. Pt and family report they are tired, inquiring about bed status- informed that we do not have any admission beds at this time.

## 2024-05-16 NOTE — Discharge Summary (Signed)
 Physician Discharge Summary   Patient: Clifford [name] MRN: [id number] DOB: 03-03-1961  Admit date:     05/15/2024  Discharge date: 05/16/24  Discharge Physician: Carliss LELON Canales   PCP: Marvine Rush, MD   Recommendations at discharge:    Pt to be discharged home.   If you experience worsening fever, chills, chest pain, shortness of breath, or other concerning symptoms, please call your PCP or go to the emergency department immediately.  Discharge Diagnoses: Principal Problem:   Visual disturbance Active Problems:   Hypertension   Hyperlipidemia  Resolved Problems:   * No resolved hospital problems. *   Hospital Course:  63 y.o. male with medical history significant for hypertension, hyperlipidemia, and nephrolithiasis who presents for evaluation of visual disturbance involving his right eye.   Patient reports that he was in his usual state until the morning of 05/05/2024 when he woke with vision disturbance.  He describes feeling as though a curtain was hanging over the top part of his right eye.  Symptoms may have improved slightly but persist.  There has not been any associated headache, change in hearing, or focal numbness or weakness.  He denies any recent chest pain or palpitations.   Patient states that ophthalmology was concerned for a stroke and recommended that he seek further evaluation in the ED.  Assessment and Plan:  Acute vision changes/concern for acute CVA - No acute findings noted on MRI brain nor MRI orbits, CVA appears ruled out.  Possibility of TIA.  A1c 5.4.  LDL 95 (slightly above goal).  Evaluated by PT/OT with no recommendations.  No swallow deficit.  Echo completed.  Patient ambulatory, eager for discharge home.  Empirically place on Plavix 75 mg daily to take along with patient's baby aspirin, statin.  Continue to monitor visual changes closely.  Recommend follow-up with neurology and ophthalmology in the outpatient setting in 1 to 2 weeks.  Ascending aortic  aneurysm - Noted incidentally on imaging, 4.5 cm.  Will give outpatient referral for CT surgery to follow.    Consultants: Teleneurology Procedures performed: None Disposition: Home Diet recommendation:  Discharge Diet Orders (From admission, onward)     Start     Ordered   05/16/24 0000  Diet - low sodium heart healthy        05/16/24 1114           Cardiac diet  DISCHARGE MEDICATION: Allergies as of 05/16/2024       Reactions   Codeine Nausea Only   Hydrocodone Nausea Only        Medication List     TAKE these medications    aspirin EC 81 MG tablet Take 81 mg by mouth daily.   atorvastatin 10 MG tablet Commonly known as: LIPITOR Take 10 mg by mouth at bedtime.   BLINK TEARS OP Place 1 drop into the right eye in the morning, at noon, and at bedtime.   clopidogrel 75 MG tablet Commonly known as: PLAVIX Take 1 tablet (75 mg total) by mouth daily. Start taking on: May 17, 2024   olmesartan 20 MG tablet Commonly known as: BENICAR Take 20 mg by mouth daily.         Discharge Exam: Filed Weights   05/15/24 1436  Weight: 99.8 kg    GENERAL:  Alert, pleasant, no acute distress  HEENT:  EOMI CARDIOVASCULAR:  RRR, no murmurs appreciated RESPIRATORY:  Clear to auscultation, no wheezing, rales, or rhonchi GASTROINTESTINAL:  Soft, nontender, nondistended EXTREMITIES:  No  LE edema bilaterally NEURO:  No new focal deficits appreciated SKIN:  No rashes noted PSYCH:  Appropriate mood and affect     Condition at discharge: improving  The results of significant diagnostics from this hospitalization (including imaging, microbiology, ancillary and laboratory) are listed below for reference.   Imaging Studies: CT ANGIO HEAD NECK W WO CM Result Date: 05/16/2024 EXAM: CT HEAD WITHOUT CTA HEAD AND NECK WITH AND WITHOUT 05/16/2024 03:01:22 AM TECHNIQUE: CTA of the head and neck was performed with and without the administration of 75 mL of iohexol  (OMNIPAQUE) 350 MG/ML injection. Noncontrast CT of the head with reconstructed 2-D images are also provided for review. Multiplanar 2D and/or 3D reformatted images are provided for review. Automated exposure control, iterative reconstruction, and/or weight based adjustment of the mA/kV was utilized to reduce the radiation dose to as low as reasonably achievable. COMPARISON: None available CLINICAL HISTORY: Stroke/TIA, determine embolic source. Pt to er, pt states that he went to go see the eye doctor on Thursday for some blurry vision, states that his eye doctor talked to another eye doctor and they think he may have had a stroke behind his eye. Denies confusion, denies weakness. Pt states that it is like there is a curtain in his R eye. FINDINGS: CT HEAD: BRAIN AND VENTRICLES: Mild chronic microvascular ischemic disease for age. Scattered calcified atherosclerosis present about the skull base. No acute intracranial hemorrhage. No mass effect. No midline shift. No extra-axial fluid collection. No evidence of acute infarct. No hydrocephalus. ORBITS: No acute abnormality. SINUSES AND MASTOIDS: No acute abnormality. CTA NECK: AORTIC ARCH AND ARCH VESSELS: Aneurysmal dilatation of the ascending aorta measuring up to 4.5 cm. Mild aortic atherosclerosis. Standard branch pattern at the aortic arch. No stenosis about the origin of the great vessels. No dissection or arterial injury. CERVICAL CAROTID ARTERIES: Right common and internal carotid arteries are patent without dissection. Mild atherosclerotic change about the right carotid bulb without stenosis. Left common and internal carotid arteries are patent without dissection. Minimal atherosclerotic change about the left carotid bulb without stenosis. No arterial injury, or hemodynamically significant stenosis by NASCET criteria. CERVICAL VERTEBRAL ARTERIES: Left vertebral artery arises directly from the aortic arch. Vertebral arteries are patent without stenosis or  dissection. No arterial injury. LUNGS AND MEDIASTINUM: Visualized upper chest demonstrates no other acute finding. Unremarkable. SOFT TISSUES: No acute abnormality. BONES: No worrisome osseous lesions. Mild to moderate spondylosis at C4-C5 and C5-C6. No acute abnormality. No other acute finding within the neck. CTA HEAD: ANTERIOR CIRCULATION: Mild atherosclerotic change about the carotid siphons without stenosis. A1 segment, anterior communicative complex, and anterior cerebral arteries patent without stenosis. No M1 stenosis or occlusion. Distal MCA branches perfusion symmetric. Note made of a small distal calcified plaque about the right MCA branches. No intracranial aneurysm. POSTERIOR CIRCULATION: Both V4 segments patent without stenosis. Both PICA patent. Basilar widely patent without stenosis. Superior cerebellar and posterior cerebral arteries patent bilaterally. OTHER: No dural venous sinus thrombosis on this non-dedicated study. . IMPRESSION: 1. Negative CTA of the head and neck. No large vessel occlusion or other emergent finding. 2. Mild for age atheromatous disease. No hemodynamically significant or correctable stenosis. 3. Ascending aortic aneurysm measuring up to 4.5 cm. Vascular surgery consultation and referral should be considered. Electronically signed by: Morene Hoard MD 05/16/2024 03:57 AM EDT RP Workstation: [id number]   MR BRAIN WO CONTRAST Result Date: 05/15/2024 EXAM: MRI BRAIN WITHOUT CONTRAST 05/15/2024 08:35:36 PM TECHNIQUE: Multiplanar multisequence MRI of the head/brain was  performed without the administration of intravenous contrast. COMPARISON: None available. CLINICAL HISTORY: Neuro deficit, acute, stroke suspected. right unilateral hemianopia. FINDINGS: BRAIN AND VENTRICLES: No acute infarct. No intracranial hemorrhage. No mass. No midline shift. No hydrocephalus. Mild scattered patchy T2 FLAIR signal abnormality seen involving the supratentorial superwhite matter,  nonspecific, but most commonly related to chronic right facet ischemic disease. Overall, changes are mild for age. The sella is unremarkable. Normal flow voids. ORBITS: No acute abnormality. SINUSES AND MASTOIDS: No acute abnormality. BONES AND SOFT TISSUES: Normal marrow signal. No acute soft tissue abnormality. IMPRESSION: 1. No acute intracranial abnormality. 2. Mild cerebral white matter disease, nonspecific, but most commonly related to chronic microvascular ischemic disease. Electronically signed by: Morene Hoard MD 05/15/2024 09:51 PM EDT RP Workstation: [id number]   MR ORBITS W WO CONTRAST Result Date: 05/15/2024 EXAM: MRI ORBITS WITHOUT AND WITH CONTRAST 05/15/2024 08:35:36 PM TECHNIQUE: Multiplanar, multisequence MRI of the orbits was performed without and with intravenous contrast. 10 mL (gadobutrol (GADAVIST) 1 MMOL/ML injection 10 mL GADOBUTROL 1 MMOL/ML IV SOLN) was administered. COMPARISON: None available CLINICAL HISTORY: Vision impairment (Ped 0-17y); right unilateral hemianopia. FINDINGS: ORBITS: Globes are symmetric in size with normal appearance and morphology. Lenses are normally located. Optic nerves are symmetric and within normal limits. There is no abnormal edema or enhancement of either optic nerve or optic nerve sheath complex. Intraconal and extraconal fat is maintained. Extraocular muscles are symmetric and normal. Lacrimal glands are normal. There is no abnormality about the orbital apices or cavernous sinus. The optic chiasm is normally situated within the suprasellar cistern. Orbital soft tissues are within normal limits. No orbital mass. No abnormal enhancement. SINUSES AND MASTOIDS: Mild mucosal thickening about the ethmoid cells and right maxillary sinus. Paranasal sinuses are otherwise clear. Prominent. BRAIN: No acute abnormality. BONES AND SOFT TISSUES: Normal bone marrow signal. No acute soft tissue abnormality. IMPRESSION: 1. Normal MRI of the orbits. No acute  abnormality. No findings to explain patient symptoms. Electronically signed by: Morene Hoard MD 05/15/2024 09:41 PM EDT RP Workstation: [id number]    Microbiology: No results found for this or any previous visit.  Labs: CBC: Recent Labs  Lab 05/15/24 1910 05/16/24 0313  WBC 8.0 7.8  HGB 14.5 13.9  HCT 44.2 42.6  MCV 87.7 89.1  PLT 180 158   Basic Metabolic Panel: Recent Labs  Lab 05/15/24 1910 05/16/24 0313  NA 142 139  K 4.3 3.9  CL 108 107  CO2 24 22  GLUCOSE 100* 106*  BUN 17 15  CREATININE 1.10 0.93  CALCIUM 9.3 8.7*   Liver Function Tests: No results for input(s): AST, ALT, ALKPHOS, BILITOT, PROT, ALBUMIN in the last 168 hours. CBG: No results for input(s): GLUCAP in the last 168 hours.  Discharge time spent: 26 minutes.  Length of inpatient stay: 0 days  Signed: Carliss LELON Canales, DO [facility] 05/16/2024

## 2024-05-16 NOTE — Progress Notes (Signed)
  Transition of Care (TOC) Screening Note   Patient Details  Name: Clifford [name] Date of Birth: 1960/08/26   Transition of Care [facility]) CM/SW Contact:    [name], LCSW Phone Number: 05/16/2024, 9:41 AM    Transition of Care Department [facility]) has reviewed patient and no TOC needs have been identified at this time. We will continue to monitor patient advancement through interdisciplinary progression rounds. If new patient transition needs arise, please place a TOC consult.    05/16/24 0941  TOC Brief Assessment  Insurance and Status Reviewed  Patient has primary care physician Yes  Home environment has been reviewed Home w/ spouse  Prior level of function: Independent  Prior/Current Home Services No current home services  Social Drivers of Health Review SDOH reviewed no interventions necessary  Readmission risk has been reviewed Yes  Transition of care needs no transition of care needs at this time

## 2024-05-17 DIAGNOSIS — R030 Elevated blood-pressure reading, without diagnosis of hypertension: Secondary | ICD-10-CM | POA: Diagnosis not present

## 2024-05-17 DIAGNOSIS — I10 Essential (primary) hypertension: Secondary | ICD-10-CM | POA: Diagnosis not present

## 2024-05-17 DIAGNOSIS — I712 Thoracic aortic aneurysm, without rupture, unspecified: Secondary | ICD-10-CM | POA: Diagnosis not present

## 2024-05-17 DIAGNOSIS — Q231 Congenital insufficiency of aortic valve: Secondary | ICD-10-CM | POA: Diagnosis not present

## 2024-05-21 ENCOUNTER — Other Ambulatory Visit (HOSPITAL_COMMUNITY): Payer: Self-pay | Admitting: Family Medicine

## 2024-05-21 DIAGNOSIS — I712 Thoracic aortic aneurysm, without rupture, unspecified: Secondary | ICD-10-CM

## 2024-05-31 ENCOUNTER — Ambulatory Visit (HOSPITAL_COMMUNITY)
Admission: RE | Admit: 2024-05-31 | Discharge: 2024-05-31 | Disposition: A | Discharge status: Home / Self Care | Source: Ambulatory Visit | Attending: Family Medicine | Admitting: Family Medicine

## 2024-05-31 DIAGNOSIS — I712 Thoracic aortic aneurysm, without rupture, unspecified: Secondary | ICD-10-CM | POA: Diagnosis not present

## 2024-05-31 DIAGNOSIS — I7121 Aneurysm of the ascending aorta, without rupture: Secondary | ICD-10-CM | POA: Diagnosis not present

## 2024-05-31 MED ORDER — IOHEXOL 350 MG/ML SOLN
100.0000 mL | Freq: Once | INTRAVENOUS | Status: AC | PRN
  Administered 2024-05-31: 100 mL via INTRAVENOUS

## 2024-06-07 DIAGNOSIS — I10 Essential (primary) hypertension: Secondary | ICD-10-CM | POA: Diagnosis not present

## 2024-06-07 DIAGNOSIS — I712 Thoracic aortic aneurysm, without rupture, unspecified: Secondary | ICD-10-CM | POA: Diagnosis not present

## 2024-06-07 DIAGNOSIS — Q231 Congenital insufficiency of aortic valve: Secondary | ICD-10-CM | POA: Diagnosis not present

## 2024-06-14 ENCOUNTER — Encounter

## 2024-06-25 ENCOUNTER — Other Ambulatory Visit: Payer: Self-pay

## 2024-06-25 ENCOUNTER — Ambulatory Visit

## 2024-06-25 VITALS — BP 118/80 | HR 92 | Resp 18 | Ht 66.0 in | Wt 226.0 lb

## 2024-06-25 DIAGNOSIS — Q2381 Bicuspid aortic valve: Secondary | ICD-10-CM

## 2024-06-25 DIAGNOSIS — I7121 Aneurysm of the ascending aorta, without rupture: Secondary | ICD-10-CM

## 2024-06-25 DIAGNOSIS — Q231 Congenital insufficiency of aortic valve: Secondary | ICD-10-CM

## 2024-06-25 NOTE — Patient Instructions (Signed)

## 2024-06-25 NOTE — Progress Notes (Signed)
 [address] [place] [address]             [phone]            Clifford [name] [id number] 07-01-1961   History of Present Illness:  Clifford [name] is a 63 year old man with medical history of hypertension, bicuspid aortic valve, congential aortic insufficiency, visual disturbance and hyperlipidemia who presents for initial encounter of ascending thoracic aortic aneurysm. This was found incidentally on CTA of chest when he presented to the emergency department for a visual disturbance. CTA of chest measured aneurysm at 4.7 cm.  He had previous imaging study done in 2020 and aneurysm was seen on this and measured 4.5 cm.  Previous echocardiogram was done in 2009 and findings were suggestive of a possible bicuspid aortic valve. He has had a heart murmur since childhood.    He presents to the clinic today with his wife and reports that he is doing well.  His blood pressure is well controlled with current medication therapy.  He has been monitoring his BP at home and readings are 130s or less/80s or less.  He is active with doing yard work.  He wants to start walking for exercise.  He does endorse some shortness of breath with exertion.  This does not happen all the time and resolves with rest. He denies chest pain, dizziness, lower leg swelling, fatigue and palpitations.    Current Outpatient Medications on File Prior to Visit  Medication Sig Dispense Refill   amLODipine (NORVASC) 5 MG tablet 1 tablet Orally Once a day; Duration: 90 days at night     aspirin  EC 81 MG tablet Take 81 mg by mouth daily.     atorvastatin  (LIPITOR) 10 MG tablet Take 10 mg by mouth at bedtime.      olmesartan (BENICAR) 40 MG tablet 1 tablet Orally Once a day; Duration: 90 days     No current facility-administered medications on file prior to visit.     ROS: Review of Systems  Constitutional:  Negative for malaise/fatigue.  Respiratory:  Positive for shortness of breath. Negative  for cough.        On exertion  Cardiovascular:  Negative for chest pain, palpitations and leg swelling.  Neurological:  Negative for dizziness.     BP 118/80 (BP Location: Left Arm)   Pulse 92   Resp 18   Ht 5' 6 (1.676 m)   Wt 226 lb (102.5 kg)   SpO2 97%   BMI 36.48 kg/m   Physical Exam Constitutional:      Appearance: Normal appearance.  HENT:     Head: Normocephalic and atraumatic.  Cardiovascular:     Rate and Rhythm: Normal rate and regular rhythm.     Heart sounds: S1 normal and S2 normal. Murmur heard.     Systolic murmur is present with a grade of 3/6.  Skin:    General: Skin is warm and dry.  Neurological:     General: No focal deficit present.     Mental Status: He is alert and oriented to person, place, and time.      Imaging: EXAM: CTA CHEST AORTA 05/31/2024 02:21:22 PM   TECHNIQUE: CTA of the chest was performed without and with the administration of 100 mL of iohexol  (OMNIPAQUE ) 350 MG/ML injection. Multiplanar reformatted images are provided for review. MIP images are provided for review. Automated exposure control, iterative reconstruction, and/or  weight based adjustment of the mA/kV was utilized to reduce the radiation dose to as low as reasonably achievable.   COMPARISON: CTA head and neck 05/16/2024, CT abdomen and pelvis without contrast 06/11/2019. No previous chest CT.   CLINICAL HISTORY: Follow up thoracic aortic aneurysm. The patient had a CTA of the head and neck with id 05/16/24, demonstrating aneurysmal prominence in the ascending thoracic aorta. This is further evaluation of this finding. A similar finding was noted on CT abdomen and pelvis without contrast 06/11/2019.   FINDINGS:   AORTA: Heavy calcification and thickening of the aortic valve leaflets. Echocardiographic assessment of valvular function is recommended.   On coronal series 8, image 67, the aortic valve measures 2.8 cm, and the aortic root at the sinuses of  Valsalva is 3.7 cm.   On series 8, image 65, the sinotubular junction is 2.9 cm.   The mid ascending aorta is 4.5 cm transverse on series 8, image 58, and 4.7 cm AP on series 10, image 78. In 2020, it was 4.5 x 4.5 cm.   The mid aortic arch is 3.1 cm on series 8, image 64.   The aortic isthmus is 2.4 cm on series 10, image 103. The proximal descending segment is 2.5 cm on series 10, image 102. The distal descending aorta is 2.4 cm on series 10, image 98.   The aorta is moderately tortuous, with a small amount of scattered calcific plaque. The great vessels are generally plaque-free except for minimal calcification at the left common carotid artery origin.   There is a normal variant 4-vessel aortic arch with arch origin of the left vertebral artery. No thoracic aortic dissection, stenosis, penetrating ulcer, or intramural hematoma.   MEDIASTINUM: Mild to moderate cardiomegaly. No pericardial effusion. There are trace 2 vessel calcific plaques in the LAD and right coronary arteries.   The pulmonary arteries and veins are normal in caliber, and no arterial embolism is seen. There are bilateral epicardial fat pads. No mediastinal lymphadenopathy.   LYMPH NODES: No mediastinal, hilar or axillary lymphadenopathy.   LUNGS AND PLEURA: The lungs are clear of infiltrates. No pulmonary nodule is seen. There is mild posterior atelectasis in the lungs.   Mosaicism noted in the left lower lobe consistent with either low lung volumes or air trapping.   No pleural effusion or pneumothorax. Mild asymmetric eventration and elevation of the left hemidiaphragm. Fatty pleural infiltration in the posterior lower thorax.   UPPER ABDOMEN: There is a 3 mm stone in the gallbladder neck and a 1 cm stone in the fundus, but no wall thickening and no biliary dilatation. The liver is moderately steatotic. No acute upper abdominal findings.   SOFT TISSUES AND BONES: Osteopenia, kyphosis, and  multilevel thoracic spine bridging enthesopathy, multilevel partial ankylosis of the lower thoracic vertebral bodies.   No acute or other significant osseous findings.   No acute soft tissue abnormality.   IMPRESSION: 1. Aneurysmal dilation of the mid ascending thoracic aorta measuring 4.5 x 4.7 cm, in 2020 was 4.5 x 4.5 cm, without dissection, stenosis, penetrating ulcer, or intramural hematoma. Semiannual follow up CTA or MRA recommended and referral to a vascular surgeon unless already done . 2. Heavy calcification and thickening of the aortic valve leaflets; recommend echocardiographic assessment of valvular function. 3. Mild to moderate cardiomegaly without pericardial effusion. Trace 2-vessel coronary artery calcifications. 4. Mosaic attenuation in the left lower lobe, which may reflect low lung volumes or air trapping. 5. Cholelithiasis. 6. Moderately steatotic liver.  7. Kyphosis and bridging enthesopathy  of the thoracic spine.   Electronically signed by: Francis Quam MD 06/03/2024 08:51 PM EST RP Workstation: [id number]     A/P: Aneurysm of ascending aorta without rupture -4.7 cm ascending thoracic aortic aneurysm on CTA of chest. -We discussed the natural history and and risk factors for growth of ascending aortic aneurysms. Discussed recommendations to minimize the risk of further expansion or dissection including careful blood pressure control, avoidance of contact sports and heavy lifting, attention to lipid management.  We covered the importance of staying never user of tobacco.  The patient does not yet meet surgical criteria of >5.0cm. The patient is aware of signs and symptoms of aortic dissection and when to present to the emergency department  -Follow up in 6 months with CTA of chest for continued surveillance   Aortic insufficiency due to bicuspid aortic valve -Echocardiogram ordered at this time since last echo was in 2009 -Referral placed to cardiology -He  should continue to monitor shortness of breath on exertion and record the frequency.  Discussed the progression symptoms of aortic insufficiency (lower leg swelling, shortness of breath at rest, fatigue, dizziness, syncope) and he states understanding of reporting worsening symptoms    Risk Modification:  Statin:  atorvastatin   Smoking cessation instruction/counseling given: never user  Patient was counseled on importance of Blood Pressure Control  They are instructed to contact their Primary Care Physician if they start to have blood pressure readings over 130s/90s. Do not ever stop blood pressure medications on your own, unless instructed by healthcare professional.  Please avoid use of Fluoroquinolones as this can potentially increase your risk of Aortic Rupture and/or Dissection  Patient educated on signs and symptoms of Aortic Dissection, handout also provided in AVS  Manuelita CHRISTELLA Rough, PA-C 06/25/24

## 2024-07-11 DIAGNOSIS — I712 Thoracic aortic aneurysm, without rupture, unspecified: Secondary | ICD-10-CM | POA: Diagnosis not present

## 2024-07-11 DIAGNOSIS — J069 Acute upper respiratory infection, unspecified: Secondary | ICD-10-CM | POA: Diagnosis not present

## 2024-07-11 DIAGNOSIS — Z125 Encounter for screening for malignant neoplasm of prostate: Secondary | ICD-10-CM | POA: Diagnosis not present

## 2024-07-11 DIAGNOSIS — Q231 Congenital insufficiency of aortic valve: Secondary | ICD-10-CM | POA: Diagnosis not present

## 2024-07-11 DIAGNOSIS — E785 Hyperlipidemia, unspecified: Secondary | ICD-10-CM | POA: Diagnosis not present

## 2024-07-11 DIAGNOSIS — I10 Essential (primary) hypertension: Secondary | ICD-10-CM | POA: Diagnosis not present

## 2024-07-11 DIAGNOSIS — Z0000 Encounter for general adult medical examination without abnormal findings: Secondary | ICD-10-CM | POA: Diagnosis not present

## 2024-07-11 DIAGNOSIS — R058 Other specified cough: Secondary | ICD-10-CM | POA: Diagnosis not present

## 2024-07-11 DIAGNOSIS — R7309 Other abnormal glucose: Secondary | ICD-10-CM | POA: Diagnosis not present

## 2024-07-16 ENCOUNTER — Ambulatory Visit (HOSPITAL_COMMUNITY): Admission: RE | Admit: 2024-07-16 | Discharge: 2024-07-16 | Attending: Family Medicine | Admitting: Family Medicine

## 2024-07-16 DIAGNOSIS — Q2381 Bicuspid aortic valve: Secondary | ICD-10-CM | POA: Insufficient documentation

## 2024-07-16 DIAGNOSIS — Q231 Congenital insufficiency of aortic valve: Secondary | ICD-10-CM | POA: Insufficient documentation

## 2024-07-16 DIAGNOSIS — I7121 Aneurysm of the ascending aorta, without rupture: Secondary | ICD-10-CM | POA: Diagnosis not present

## 2024-07-16 LAB — ECHOCARDIOGRAM COMPLETE
AR max vel: 2.33 cm2
AV Area VTI: 2.14 cm2
AV Area mean vel: 1.92 cm2
AV Mean grad: 16 mmHg
AV Peak grad: 23.7 mmHg
Ao pk vel: 2.44 m/s
Area-P 1/2: 4.01 cm2
Est EF: >75
MV VTI: 2.58 cm2

## 2024-07-16 MED ORDER — PERFLUTREN LIPID MICROSPHERE
1.0000 mL | INTRAVENOUS | Status: AC | PRN
  Administered 2024-07-16: 12:00:00 3 mL via INTRAVENOUS

## 2024-07-23 ENCOUNTER — Other Ambulatory Visit: Payer: Self-pay

## 2024-07-23 NOTE — Progress Notes (Signed)
-  Called and discussed echocardiogram results with patient -He has follow up with cardiology on 09/2024 -Continue follow up for ascending thoracic aortic aneurysm in 6 months with CTA of chest

## 2024-09-05 ENCOUNTER — Ambulatory Visit: Admitting: Cardiology

## 2024-09-05 ENCOUNTER — Encounter: Payer: Self-pay | Admitting: Cardiology

## 2024-09-05 VITALS — BP 130/82 | HR 76 | Ht 66.0 in | Wt 223.0 lb

## 2024-09-05 DIAGNOSIS — I10 Essential (primary) hypertension: Secondary | ICD-10-CM | POA: Diagnosis not present

## 2024-09-05 DIAGNOSIS — I350 Nonrheumatic aortic (valve) stenosis: Secondary | ICD-10-CM

## 2024-09-05 DIAGNOSIS — Z01810 Encounter for preprocedural cardiovascular examination: Secondary | ICD-10-CM | POA: Diagnosis not present

## 2024-09-05 DIAGNOSIS — I517 Cardiomegaly: Secondary | ICD-10-CM

## 2024-09-05 NOTE — Patient Instructions (Addendum)
 Medication Instructions:  Your physician recommends that you continue on your current medications as directed. Please refer to the Current Medication list given to you today.   Labwork: CBC to be completed prior to Cardiac MRI at [facility] in [place]   Testing/Procedures:   You are scheduled for Cardiac MRI at the location below.  Please arrive for your appointment at ______________ . ?  [facility] [address] [place], [place] [address] Please take advantage of the free valet parking available at the [facility] and [employer] (Entrance C).  Proceed to the [facility] Radiology Department (First Floor) for check-in.   OR   [facility] [address] [place], [place] [address] Please go to the [facility] and check-in with the desk attendant.   Magnetic resonance imaging (MRI) is a painless test that produces images of the inside of the body without using Xrays.  During an MRI, strong magnets and radio waves work together in a data processing manager to form detailed images.   MRI images may provide more details about a medical condition than X-rays, CT scans, and ultrasounds can provide.  You may be given earphones to listen for instructions.  You may eat a light breakfast and take medications as ordered with the exception of furosemide, hydrochlorothiazide, chlorthalidone or spironolactone (or any other fluid pill). If you are undergoing a stress MRI, please avoid stimulants for 12 hr prior to test. (I.e. Caffeine, nicotine, chocolate, or antihistamine medications)  If your provider has ordered anti-anxiety medications for this test, then you will need a driver.  An IV will be inserted into one of your veins. Contrast material will be injected into your IV. It will leave your body through your urine within a day. You may be told to drink plenty of fluids to help flush the contrast material out of your system.  You will be asked to remove  all metal, including: Watch, jewelry, and other metal objects including hearing aids, hair pieces and dentures. Also wearable glucose monitoring systems (ie. Freestyle Libre and Omnipods) (Braces and fillings normally are not a problem.)   TEST WILL TAKE APPROXIMATELY 1 HOUR  PLEASE NOTIFY SCHEDULING AT LEAST 24 HOURS IN ADVANCE IF YOU ARE UNABLE TO KEEP YOUR APPOINTMENT. [phone]  For more information and frequently asked questions, please visit our website : [email or web address]  Please call the Cardiac Imaging Nurse Navigators with any questions/concerns. [phone] Office    Follow-Up: Your physician recommends that you schedule a follow-up appointment in: 6 months  Any Other Special Instructions Will Be Listed Below (If Applicable).  If you need a refill on your cardiac medications before your next appointment, please call your pharmacy.

## 2024-09-05 NOTE — Progress Notes (Signed)
 "     Clinical Summary Clifford [name] is a 64 y.o.male seen today as a new consult for the following medical problems, referred by PA Fenyak    1.LVH -07/2024 echo: severe symmetrical LVH, hyperdynamic LV function >75%. Mild dynamic LVOT gradient in this setting peak 21 mmHg. Grade I dd. Mild MS mean grad 5, mild aortic stenosis mean grad 16, AVA VTI 2.14, , ascendning aorta 49 mm - septum measured 2.1 cm, posterior wall 1.8 cm.    - he reports long history of HTN, thought just requiring 2 agents.  - denies chest pains, denies significant DOE, no near syncope, no syncope   2.TIA? - admission 05/2024 with vision change - No acute findings noted on MRI brain nor MRI orbits  - plavix  was added to his ASA  3. Ascending aortic aneurysm - 05/2024 CTA chest: 4.7 cm ascending aorta, recs for repeat CT 6 months. Trace coronary calcification, heavily calfcifed aortic valve - followed by CT surgery  4. ?Bicuspid AV - 2009 echo suggested bicuspid AV per notes, I cannot find this report - not able to determine number of cuspis by 07/2024 echo - 07/2024 echo: severe symmetrical LVH, hyperdynamic LV function >75%. Mild dynamic LVOT gradient in this setting peak 21 mmHg. Grade I dd. Mild MS mean grad 5, mild aortic stenosis mean grad 16, AVA VTI 2.14, , ascendning aorta 49 mm   Past Medical History:  Diagnosis Date   History of kidney stones    Hyperlipidemia    Hypertension      Allergies[1]   Current Outpatient Medications  Medication Sig Dispense Refill   amLODipine (NORVASC) 5 MG tablet 1 tablet Orally Once a day; Duration: 90 days at night     aspirin  EC 81 MG tablet Take 81 mg by mouth daily.     atorvastatin  (LIPITOR) 10 MG tablet Take 10 mg by mouth at bedtime.      olmesartan (BENICAR) 40 MG tablet 1 tablet Orally Once a day; Duration: 90 days     No current facility-administered medications for this visit.     Past Surgical History:  Procedure Laterality Date    COLONOSCOPY N/A 03/18/2017   Procedure: COLONOSCOPY;  Surgeon: Shaaron Lamar HERO, MD;  Location: [facility];  Service: Endoscopy;  Laterality: N/A;  11:30 AM     Allergies[2]    Family History  Problem Relation Age of Onset   Cancer Mother    Heart Problems Mother    Heart attack Father    Stroke Other      Social History Mr. Molden reports that he has never smoked. He has never used smokeless tobacco. Mr. Lightner reports no history of alcohol  use.    Physical Examination Today's Vitals   09/05/24 1311 09/05/24 1318  BP: (!) 132/92 130/82  Pulse: 76   SpO2: 98%   Weight: 223 lb (101.2 kg)   Height: 5' 6 (1.676 m)    Body mass index is 35.99 kg/m.  Gen: resting comfortably, no acute distress HEENT: no scleral icterus, pupils equal round and reactive, no palptable cervical adenopathy,  CV: RRR, 3/6 systolic murmur rusb, no jvd Resp: Clear to auscultation bilaterally GI: abdomen is soft, non-tender, non-distended, normal bowel sounds, no hepatosplenomegaly MSK: extremities are warm, no edema.  Skin: warm, no rash Neuro:  no focal deficits Psych: appropriate affect     Assessment and Plan  1.LVH - severe symmetrical LV hypertroph, with a mild dynamic LVOT gradient in setting of hyperdynamic LV  function. - wall thickness around 2 cm, not clearly causes by HTN as controlled on just 2 agents and has been consistently on medications.  - check cMRI to determine if perhaps a HOCM variant.  -EKG today SR, LVH  2. Aortic aneurysm - followed by CT surgery, continue serial CT scans  3. Aortic stenosis - mentioned of a possible bicuspid AV by 2009 echo, cannot find this report. From most recent echo cusps were not clearly visualized to determine - certaintily with aortic stenosis and aortic aneurysm would clinically fit that valve would be bicuspid - mild AS, will monitor at this time.  4.Mitral stenosis - mild by echo, continue to monitor  5.HTN - at goal, continue  current meds.       Dorn PHEBE Ross, M.D.     [1]  Allergies Allergen Reactions   Quinolones Other (See Comments)    Ascending thoracic aortic aneurysm, use with caution   Codeine Nausea Only   Hydrocodone Nausea Only  [2]  Allergies Allergen Reactions   Quinolones Other (See Comments)    Ascending thoracic aortic aneurysm, use with caution   Codeine Nausea Only   Hydrocodone Nausea Only   "

## 2024-09-07 LAB — CBC
Hematocrit: 44.7 % (ref 37.5–51.0)
Hemoglobin: 14.6 g/dL (ref 13.0–17.7)
MCH: 29.3 pg (ref 26.6–33.0)
MCHC: 32.7 g/dL (ref 31.5–35.7)
MCV: 90 fL (ref 79–97)
Platelets: 181 10*3/uL (ref 150–450)
RBC: 4.98 x10E6/uL (ref 4.14–5.80)
RDW: 12.8 % (ref 11.6–15.4)
WBC: 6.5 10*3/uL (ref 3.4–10.8)

## 2024-09-14 ENCOUNTER — Ambulatory Visit (HOSPITAL_COMMUNITY)
